# Patient Record
Sex: Female | Born: 1955 | Race: White | Hispanic: No | Marital: Married | Smoking: Never smoker
Health system: Southern US, Community
[De-identification: ages and names within clinical notes are randomized; demographics above are authoritative.]

## PROBLEM LIST (undated history)

## (undated) DIAGNOSIS — E079 Disorder of thyroid, unspecified: Secondary | ICD-10-CM

## (undated) DIAGNOSIS — H539 Unspecified visual disturbance: Secondary | ICD-10-CM

## (undated) HISTORY — DX: Unspecified visual disturbance: H53.9

## (undated) HISTORY — DX: Disorder of thyroid, unspecified: E07.9

## (undated) HISTORY — PX: WISDOM TOOTH EXTRACTION: SHX21

## (undated) HISTORY — PX: TONSILLECTOMY: SUR1361

---

## 1999-09-13 ENCOUNTER — Other Ambulatory Visit: Admission: RE | Admit: 1999-09-13 | Discharge: 1999-09-13 | Payer: Self-pay | Admitting: Obstetrics and Gynecology

## 2003-04-25 ENCOUNTER — Other Ambulatory Visit: Admission: RE | Admit: 2003-04-25 | Discharge: 2003-04-25 | Payer: Self-pay | Admitting: Gynecology

## 2006-01-04 ENCOUNTER — Emergency Department (HOSPITAL_COMMUNITY): Admission: EM | Admit: 2006-01-04 | Discharge: 2006-01-04 | Payer: Self-pay | Admitting: Family Medicine

## 2007-08-12 ENCOUNTER — Ambulatory Visit: Payer: Self-pay | Admitting: Internal Medicine

## 2007-08-26 ENCOUNTER — Encounter: Payer: Self-pay | Admitting: Internal Medicine

## 2007-08-26 ENCOUNTER — Ambulatory Visit: Payer: Self-pay | Admitting: Internal Medicine

## 2009-07-07 DIAGNOSIS — E063 Autoimmune thyroiditis: Secondary | ICD-10-CM | POA: Insufficient documentation

## 2009-09-07 DIAGNOSIS — M899 Disorder of bone, unspecified: Secondary | ICD-10-CM | POA: Insufficient documentation

## 2009-09-07 DIAGNOSIS — Z0000 Encounter for general adult medical examination without abnormal findings: Secondary | ICD-10-CM | POA: Insufficient documentation

## 2009-09-14 ENCOUNTER — Encounter: Admission: RE | Admit: 2009-09-14 | Discharge: 2009-09-14 | Payer: Self-pay | Admitting: Internal Medicine

## 2010-09-13 DIAGNOSIS — N6019 Diffuse cystic mastopathy of unspecified breast: Secondary | ICD-10-CM | POA: Insufficient documentation

## 2010-10-01 ENCOUNTER — Encounter: Admission: RE | Admit: 2010-10-01 | Discharge: 2010-10-01 | Payer: Self-pay | Admitting: Internal Medicine

## 2011-09-18 ENCOUNTER — Inpatient Hospital Stay (INDEPENDENT_AMBULATORY_CARE_PROVIDER_SITE_OTHER)
Admission: RE | Admit: 2011-09-18 | Discharge: 2011-09-18 | Disposition: A | Discharge status: Home / Self Care | Payer: 59 | Source: Ambulatory Visit | Attending: Family Medicine | Admitting: Family Medicine

## 2011-09-18 DIAGNOSIS — L259 Unspecified contact dermatitis, unspecified cause: Secondary | ICD-10-CM

## 2012-08-19 ENCOUNTER — Other Ambulatory Visit: Payer: Self-pay | Admitting: Internal Medicine

## 2012-08-19 DIAGNOSIS — Z1231 Encounter for screening mammogram for malignant neoplasm of breast: Secondary | ICD-10-CM

## 2012-09-03 ENCOUNTER — Ambulatory Visit: Payer: 59

## 2012-09-09 ENCOUNTER — Ambulatory Visit: Payer: 59

## 2012-10-01 ENCOUNTER — Ambulatory Visit
Admission: RE | Admit: 2012-10-01 | Discharge: 2012-10-01 | Disposition: A | Discharge status: Home / Self Care | Payer: 59 | Source: Ambulatory Visit | Attending: Internal Medicine | Admitting: Internal Medicine

## 2012-10-01 DIAGNOSIS — Z1231 Encounter for screening mammogram for malignant neoplasm of breast: Secondary | ICD-10-CM

## 2012-12-02 DIAGNOSIS — E559 Vitamin D deficiency, unspecified: Secondary | ICD-10-CM | POA: Insufficient documentation

## 2014-02-15 DIAGNOSIS — E039 Hypothyroidism, unspecified: Secondary | ICD-10-CM | POA: Insufficient documentation

## 2015-08-29 ENCOUNTER — Other Ambulatory Visit: Payer: Self-pay | Admitting: Internal Medicine

## 2015-08-29 DIAGNOSIS — Z1231 Encounter for screening mammogram for malignant neoplasm of breast: Secondary | ICD-10-CM

## 2015-09-07 ENCOUNTER — Ambulatory Visit
Admission: RE | Admit: 2015-09-07 | Discharge: 2015-09-07 | Disposition: A | Discharge status: Home / Self Care | Payer: BLUE CROSS/BLUE SHIELD | Source: Ambulatory Visit | Attending: Internal Medicine | Admitting: Internal Medicine

## 2015-09-07 ENCOUNTER — Other Ambulatory Visit: Payer: Self-pay | Admitting: Internal Medicine

## 2015-09-07 DIAGNOSIS — Z1231 Encounter for screening mammogram for malignant neoplasm of breast: Secondary | ICD-10-CM

## 2016-07-04 DIAGNOSIS — M859 Disorder of bone density and structure, unspecified: Secondary | ICD-10-CM | POA: Diagnosis not present

## 2016-07-04 DIAGNOSIS — Z0000 Encounter for general adult medical examination without abnormal findings: Secondary | ICD-10-CM | POA: Diagnosis not present

## 2016-07-04 DIAGNOSIS — E063 Autoimmune thyroiditis: Secondary | ICD-10-CM | POA: Diagnosis not present

## 2016-07-19 DIAGNOSIS — Z1389 Encounter for screening for other disorder: Secondary | ICD-10-CM | POA: Diagnosis not present

## 2016-07-19 DIAGNOSIS — M898X1 Other specified disorders of bone, shoulder: Secondary | ICD-10-CM | POA: Diagnosis not present

## 2016-07-19 DIAGNOSIS — E038 Other specified hypothyroidism: Secondary | ICD-10-CM | POA: Diagnosis not present

## 2016-07-19 DIAGNOSIS — Z0000 Encounter for general adult medical examination without abnormal findings: Secondary | ICD-10-CM | POA: Diagnosis not present

## 2016-07-29 DIAGNOSIS — D225 Melanocytic nevi of trunk: Secondary | ICD-10-CM | POA: Diagnosis not present

## 2016-07-29 DIAGNOSIS — L738 Other specified follicular disorders: Secondary | ICD-10-CM | POA: Diagnosis not present

## 2016-07-29 DIAGNOSIS — D2262 Melanocytic nevi of left upper limb, including shoulder: Secondary | ICD-10-CM | POA: Diagnosis not present

## 2016-07-29 DIAGNOSIS — L821 Other seborrheic keratosis: Secondary | ICD-10-CM | POA: Diagnosis not present

## 2016-07-29 DIAGNOSIS — L570 Actinic keratosis: Secondary | ICD-10-CM | POA: Diagnosis not present

## 2016-08-21 DIAGNOSIS — L308 Other specified dermatitis: Secondary | ICD-10-CM | POA: Diagnosis not present

## 2016-08-26 DIAGNOSIS — B86 Scabies: Secondary | ICD-10-CM | POA: Diagnosis not present

## 2016-09-12 DIAGNOSIS — L2089 Other atopic dermatitis: Secondary | ICD-10-CM | POA: Diagnosis not present

## 2016-09-23 DIAGNOSIS — R300 Dysuria: Secondary | ICD-10-CM | POA: Diagnosis not present

## 2016-09-23 DIAGNOSIS — N390 Urinary tract infection, site not specified: Secondary | ICD-10-CM | POA: Diagnosis not present

## 2016-10-14 DIAGNOSIS — R300 Dysuria: Secondary | ICD-10-CM | POA: Diagnosis not present

## 2016-10-14 DIAGNOSIS — R358 Other polyuria: Secondary | ICD-10-CM | POA: Diagnosis not present

## 2016-11-18 DIAGNOSIS — R300 Dysuria: Secondary | ICD-10-CM | POA: Diagnosis not present

## 2016-11-18 DIAGNOSIS — B349 Viral infection, unspecified: Secondary | ICD-10-CM | POA: Diagnosis not present

## 2016-11-18 DIAGNOSIS — J45909 Unspecified asthma, uncomplicated: Secondary | ICD-10-CM | POA: Diagnosis not present

## 2016-11-18 DIAGNOSIS — R5381 Other malaise: Secondary | ICD-10-CM | POA: Diagnosis not present

## 2016-11-18 DIAGNOSIS — R05 Cough: Secondary | ICD-10-CM | POA: Diagnosis not present

## 2016-11-18 DIAGNOSIS — N390 Urinary tract infection, site not specified: Secondary | ICD-10-CM | POA: Diagnosis not present

## 2016-11-18 DIAGNOSIS — E038 Other specified hypothyroidism: Secondary | ICD-10-CM | POA: Diagnosis not present

## 2017-01-06 ENCOUNTER — Other Ambulatory Visit: Payer: Self-pay | Admitting: Urology

## 2017-01-06 DIAGNOSIS — R3914 Feeling of incomplete bladder emptying: Secondary | ICD-10-CM

## 2017-01-06 DIAGNOSIS — N3020 Other chronic cystitis without hematuria: Secondary | ICD-10-CM | POA: Diagnosis not present

## 2017-01-06 DIAGNOSIS — N952 Postmenopausal atrophic vaginitis: Secondary | ICD-10-CM | POA: Diagnosis not present

## 2017-01-15 ENCOUNTER — Ambulatory Visit (HOSPITAL_COMMUNITY): Admission: RE | Admit: 2017-01-15 | Payer: BLUE CROSS/BLUE SHIELD | Source: Ambulatory Visit

## 2017-01-15 ENCOUNTER — Ambulatory Visit (HOSPITAL_COMMUNITY): Payer: BLUE CROSS/BLUE SHIELD

## 2017-01-15 DIAGNOSIS — N200 Calculus of kidney: Secondary | ICD-10-CM | POA: Diagnosis not present

## 2017-01-15 DIAGNOSIS — N3020 Other chronic cystitis without hematuria: Secondary | ICD-10-CM | POA: Diagnosis not present

## 2017-02-10 DIAGNOSIS — R3914 Feeling of incomplete bladder emptying: Secondary | ICD-10-CM | POA: Diagnosis not present

## 2017-02-10 DIAGNOSIS — N952 Postmenopausal atrophic vaginitis: Secondary | ICD-10-CM | POA: Diagnosis not present

## 2017-02-10 DIAGNOSIS — N200 Calculus of kidney: Secondary | ICD-10-CM | POA: Diagnosis not present

## 2017-02-10 DIAGNOSIS — N3020 Other chronic cystitis without hematuria: Secondary | ICD-10-CM | POA: Diagnosis not present

## 2017-02-12 DIAGNOSIS — N319 Neuromuscular dysfunction of bladder, unspecified: Secondary | ICD-10-CM | POA: Diagnosis not present

## 2017-02-12 DIAGNOSIS — R209 Unspecified disturbances of skin sensation: Secondary | ICD-10-CM | POA: Diagnosis not present

## 2017-02-12 DIAGNOSIS — R5381 Other malaise: Secondary | ICD-10-CM | POA: Diagnosis not present

## 2017-02-12 DIAGNOSIS — Z6826 Body mass index (BMI) 26.0-26.9, adult: Secondary | ICD-10-CM | POA: Diagnosis not present

## 2017-02-26 ENCOUNTER — Other Ambulatory Visit (HOSPITAL_COMMUNITY): Payer: Self-pay | Admitting: Internal Medicine

## 2017-02-26 DIAGNOSIS — R209 Unspecified disturbances of skin sensation: Principal | ICD-10-CM

## 2017-02-26 DIAGNOSIS — IMO0001 Reserved for inherently not codable concepts without codable children: Secondary | ICD-10-CM

## 2017-02-27 ENCOUNTER — Ambulatory Visit (HOSPITAL_COMMUNITY): Admission: RE | Admit: 2017-02-27 | Payer: BLUE CROSS/BLUE SHIELD | Source: Ambulatory Visit

## 2017-03-03 ENCOUNTER — Encounter: Payer: Self-pay | Admitting: Neurology

## 2017-03-03 ENCOUNTER — Ambulatory Visit (INDEPENDENT_AMBULATORY_CARE_PROVIDER_SITE_OTHER): Payer: BLUE CROSS/BLUE SHIELD | Admitting: Neurology

## 2017-03-03 ENCOUNTER — Encounter (INDEPENDENT_AMBULATORY_CARE_PROVIDER_SITE_OTHER): Payer: Self-pay

## 2017-03-03 VITALS — BP 145/92 | HR 77 | Resp 16 | Ht 66.0 in | Wt 166.0 lb

## 2017-03-03 DIAGNOSIS — N390 Urinary tract infection, site not specified: Secondary | ICD-10-CM | POA: Diagnosis not present

## 2017-03-03 DIAGNOSIS — R5383 Other fatigue: Secondary | ICD-10-CM | POA: Diagnosis not present

## 2017-03-03 DIAGNOSIS — Z820 Family history of epilepsy and other diseases of the nervous system: Secondary | ICD-10-CM | POA: Diagnosis not present

## 2017-03-03 DIAGNOSIS — R350 Frequency of micturition: Secondary | ICD-10-CM

## 2017-03-03 DIAGNOSIS — N312 Flaccid neuropathic bladder, not elsewhere classified: Secondary | ICD-10-CM | POA: Diagnosis not present

## 2017-03-03 NOTE — Progress Notes (Signed)
[facility]  PATIENT: Sandra Wyatt DOB: 1956-03-03  REFERRING DOCTOR OR PCP:  Dr. Osborne Wyatt (PCP); Dr. Tresa Wyatt (Urology) SOURCE: patient, notes from Dr. Osborne Wyatt and Sandra Wyatt.  _________________________________   HISTORICAL  CHIEF COMPLAINT:  Chief Complaint  Patient presents with  . Bladder Disturbance    Sandra Wyatt is here for r/o MS.  Sts. she was seen by Urology (Dr. Tresa Wyatt), for incomplete emptying of bladder.  She also c/o intermittent vibration sensation both feet onset about 46mos. ago.  Sts. her brother was dx. with MS at age 56, has never received any treatment and has had no further sx.  Also has a niece recently dx. with MS.  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  She is a 61 yo woman who has had recurrent UTIs x 2-3 years.  She was referred to urology and a PVR was elevated at 322 mL.    Also, when she had an ultrasound (pelvic) 6 years ago, she was noted to have a post void residual.    Her last UTI was successfully treated with Keflex.   She has had multiple UTIs. Years. She notes urinary retention and difficulty emptying her bladder at baseline. The symptoms worsen when she had an infection.  About 3 years ago, she noted an episode with blurry vision out of the left eye for several days.   She has not noted any episodes of difficult gait, numbness, weakness or ataxia.   However, 6 months ago, she began to experience a vibrating sensation in her legs, left more than right.   She feels her muscles are tense and can't completely relax in her legs.    She has noted much more fatigue recently.   She has hypothyroid but is on synthroid and labs have been good.    She sleeps well at night.   She snores but has never been noted to have pauses in breathing or  night time gasping/snorting.     She denies depression or anxiety.    She denies any cognitive issues.     I reviewed notes from Dr. Tresa Wyatt at [facility] u[facility]. A post void residual was 322 mL and she has recurrent UTIs. She is her to have a hypocontractile bladder.  Notes from Dr. Osborne Wyatt were also reviewed.  Her brother was diagnosed with MS at age 17 when he had severe numbness but has done well.   His diagnosis was apparently confirmed with CSF analysis.   His daughter (her niece) was diagnosed with MS in her early 41's and recently had optic neuritis.     REVIEW OF SYSTEMS: Constitutional: No fevers, chills, sweats, or change in appetite Eyes: No visual changes, double vision, eye pain Ear, nose and throat: No hearing loss, ear pain, nasal congestion, sore throat Cardiovascular: No chest pain, palpitations Respiratory: No shortness of breath at rest or with exertion.   No wheezes GastrointestinaI: No nausea, vomiting, diarrhea, abdominal pain, fecal incontinence Genitourinary: No dysuria, urinary retention or frequency.  No nocturia. Musculoskeletal: No neck pain, back pain Integumentary: No rash, pruritus, skin lesions Neurological: as above Psychiatric: No depression at this time.  No anxiety Endocrine: No palpitations, diaphoresis, change in appetite, change in weigh or increased thirst Hematologic/Lymphatic: No anemia, purpura, petechiae. Allergic/Immunologic: No itchy/runny eyes, nasal congestion, recent allergic reactions, rashes  ALLERGIES: Allergies  Allergen Reactions  . Sulfa Antibiotics Rash    HOME MEDICATIONS:  Current Outpatient Prescriptions:  .  PREMARIN vaginal cream, INSERT 1gram via applicator VAGINALLY ONCE WEEKLY, Disp: , Rfl: 11 .  SYNTHROID 112 MCG tablet, Take 112 mcg by mouth daily., Disp: , Rfl: 5  PAST MEDICAL HISTORY: Past Medical History:  Diagnosis Date  . Vision abnormalities     PAST SURGICAL HISTORY: Past Surgical History:  Procedure  Laterality Date  . TONSILLECTOMY    . WISDOM TOOTH EXTRACTION      FAMILY HISTORY: Family History  Problem Relation Age of Onset  . Stroke Mother   . Heart disease Mother   . Diabetes Father   . Migraines Sister   . Fibromyalgia Sister   . Multiple sclerosis Brother   . Lymphoma Sister   . Renal Disease Sister   . Multiple sclerosis Other   . HIV/AIDS Brother     SOCIAL HISTORY:  Social History   Social History  . Marital status: Married    Spouse name: N/A  . Number of children: N/A  . Years of education: N/A   Occupational History  . Not on file.   Social History Main Topics  . Smoking status: Never Smoker  . Smokeless tobacco: Never Used  . Alcohol use Yes     Comment: rare  . Drug use: No  . Sexual activity: Not on file   Other Topics Concern  . Not on file   Social History Narrative  . No narrative on file     PHYSICAL EXAM  Vitals:   03/03/17 0901  BP: (!) 145/92  Pulse: 77  Resp: 16  Weight: 166 lb (75.3 kg)  Height: 5\' 6"  (1.676 m)    Body mass index is 26.79 kg/m.   General: The patient is well-developed and well-nourished and in no acute distress  Eyes:  Funduscopic exam shows normal optic discs and retinal vessels.  Neck: The neck is supple, no carotid bruits are noted.  The neck is nontender.  Cardiovascular: The heart has a regular rate and rhythm with a normal S1 and S2. There were no murmurs, gallops or rubs. Lungs are clear to auscultation.  Skin: Extremities are without significant edema.  Musculoskeletal:  Back is nontender  Neurologic Exam  Mental status: The patient is alert and oriented x 3 at the time of the examination. The patient has apparent normal recent and remote memory, with an apparently normal attention span and concentration ability.   Speech is normal.  Cranial nerves: Extraocular movements are full. Pupils are equal, round, and reactive to light and accomodation.  Visual fields are full.  Facial  symmetry is present. There is good facial sensation to soft touch bilaterally.Facial strength is normal.  Trapezius and sternocleidomastoid strength is normal. No dysarthria is noted.  The tongue is midline, and the patient has symmetric elevation of the soft palate. No obvious hearing deficits are noted.  Motor:  Muscle bulk is normal.   Tone is normal. Strength is  5 / 5 in all 4 extremities.   Sensory: Sensory testing is intact to pinprick, soft touch and vibration sensation in all 4 extremities.  Coordination: Cerebellar testing reveals good finger-nose-finger and heel-to-shin bilaterally.  Gait and station: Station is normal.   Gait is normal. Tandem gait is normal. Romberg is negative.   Reflexes: Deep tendon reflexes are symmetric and 2+ bilaterally in arms and 3+ at the knees and 2+ at the ankles.Marland Kitchen  Plantar responses are flexor.    DIAGNOSTIC DATA (LABS, IMAGING, TESTING) - I reviewed patient records, labs, notes, testing and imaging myself where available.    ASSESSMENT AND PLAN  Hypotonic bladder - Plan: MR BRAIN W WO CONTRAST, MR CERVICAL SPINE W WO CONTRAST, MR THORACIC SPINE W WO CONTRAST  Other fatigue  Family history of MS (multiple sclerosis) - Plan: MR BRAIN W WO CONTRAST, MR CERVICAL SPINE W WO CONTRAST, MR THORACIC SPINE W WO CONTRAST  Frequent UTI  Urinary frequency   In summary, Mrs. Lokken is a 61 year old woman who has had a two to three-year history of hypotonic bladder and also had an episode of visual disturbance lasting several days about 3 years ago.   More recently she has had a vibrating sensory alteration in her legs, left greater than right, and the onset of fatigue without explanation. She has a strong family history of MS (Brother and niece).   We discussed that having a first-degree relative with MS does increase her risk by at least a factor of 10 (to about 1:50).    We need to obtain an MRI of the brain and the cervical and thoracic spine to  determine the etiology of her bladder hypotonia and other symptoms.  I am less concerned about a cauda equina syndrome so we do not need to check a lumbar spine at this time.   If she has lesions consistent with MS, then I will discuss the various disease modifying therapies with her and initiate one. If she has a compressive myelopathy or other finding, we will need to consider referral to neurosurgery or other treatment.     If the bladder retention worsens, consider bethanechol or Flomax or other treatment.    She'll return to see me in 3 months but we will call her with the results of the MRIs and move her appointment up much sooner based on the results. She should call us if she has any new or worsening neurologic symptoms.  Thank you for asking me to see Mrs. Juenger for neurologic consultation. Please let me know if I can be of further assistance with her or other patients in the future. Patrice Moates A. Felecia Shelling, MD, PhD 0/51/1021, 1:17 AM Certified in Neurology, Clinical Neurophysiology, Sleep Medicine, Pain Medicine and Neuroimaging  [facility] [address] [place], [place] [address] [phone]

## 2017-03-04 ENCOUNTER — Other Ambulatory Visit: Payer: Self-pay | Admitting: Internal Medicine

## 2017-03-04 ENCOUNTER — Telehealth: Payer: Self-pay | Admitting: Neurology

## 2017-03-04 DIAGNOSIS — R209 Unspecified disturbances of skin sensation: Principal | ICD-10-CM

## 2017-03-04 DIAGNOSIS — IMO0001 Reserved for inherently not codable concepts without codable children: Secondary | ICD-10-CM

## 2017-03-04 NOTE — Telephone Encounter (Signed)
Noted/fim 

## 2017-03-04 NOTE — Telephone Encounter (Signed)
I spoke with the patient and informed her of the situation how Dr. Osborne Casco has the MR Brain approved under his name and it still has valid dates.. I informed her to go to [facility] because she can have all 3 of the exams there and Dr. Felecia Shelling would be able to see all 3 of the exams..I gave her GI phone number she stated she would call and schedule.Marland Kitchen

## 2017-03-04 NOTE — Telephone Encounter (Signed)
Sandra Wyatt it is a MR brain w/wo contrast and it is approved from 02/27/17 to 03/27/17 by Dr. Osborne Casco office.

## 2017-03-04 NOTE — Telephone Encounter (Signed)
I am unable to get the MR Brain approved because Dr. Osborne Casco already approved that under his name with expire dates of 02/27/17 to 03/27/17. I did get the MR Cervical & MR Thoracic approved.. Do you want me to proceed with having her Cervical & Thoracic scheduled?

## 2017-03-05 ENCOUNTER — Ambulatory Visit
Admission: RE | Admit: 2017-03-05 | Discharge: 2017-03-05 | Disposition: A | Discharge status: Home / Self Care | Payer: BLUE CROSS/BLUE SHIELD | Source: Ambulatory Visit | Attending: Internal Medicine | Admitting: Internal Medicine

## 2017-03-05 DIAGNOSIS — R209 Unspecified disturbances of skin sensation: Principal | ICD-10-CM

## 2017-03-05 DIAGNOSIS — Z820 Family history of epilepsy and other diseases of the nervous system: Secondary | ICD-10-CM | POA: Diagnosis not present

## 2017-03-05 DIAGNOSIS — IMO0001 Reserved for inherently not codable concepts without codable children: Secondary | ICD-10-CM

## 2017-03-05 MED ORDER — GADOBENATE DIMEGLUMINE 529 MG/ML IV SOLN
15.0000 mL | Freq: Once | INTRAVENOUS | Status: AC | PRN
  Administered 2017-03-05: 15 mL via INTRAVENOUS

## 2017-03-06 ENCOUNTER — Telehealth: Payer: Self-pay | Admitting: Neurology

## 2017-03-06 NOTE — Telephone Encounter (Signed)
I have spoken with pt. and explained that, since MRI brain was done at [facility], [facility] will be able to see it/fim

## 2017-03-06 NOTE — Telephone Encounter (Signed)
Pt wanted Dr Felecia Shelling to know that  Dr.Tisovec([facility] [phone]) did the MRI on yesterday and is not sure if the results would be only given to Dr Osborne Casco or shared with Dr Felecia Shelling as well. A message can be left on her home or cell # if need be

## 2017-03-23 ENCOUNTER — Ambulatory Visit
Admission: RE | Admit: 2017-03-23 | Discharge: 2017-03-23 | Disposition: A | Discharge status: Home / Self Care | Payer: BLUE CROSS/BLUE SHIELD | Source: Ambulatory Visit | Attending: Neurology | Admitting: Neurology

## 2017-03-23 DIAGNOSIS — Z820 Family history of epilepsy and other diseases of the nervous system: Secondary | ICD-10-CM

## 2017-03-23 DIAGNOSIS — M4802 Spinal stenosis, cervical region: Secondary | ICD-10-CM | POA: Diagnosis not present

## 2017-03-23 DIAGNOSIS — M5124 Other intervertebral disc displacement, thoracic region: Secondary | ICD-10-CM | POA: Diagnosis not present

## 2017-03-23 DIAGNOSIS — N312 Flaccid neuropathic bladder, not elsewhere classified: Secondary | ICD-10-CM

## 2017-03-23 MED ORDER — GADOBENATE DIMEGLUMINE 529 MG/ML IV SOLN
15.0000 mL | Freq: Once | INTRAVENOUS | Status: AC | PRN
  Administered 2017-03-23: 15 mL via INTRAVENOUS

## 2017-03-25 ENCOUNTER — Telehealth: Payer: Self-pay | Admitting: *Deleted

## 2017-03-25 NOTE — Telephone Encounter (Signed)
-----   Message from Britt Bottom, MD sent at 03/24/2017  5:42 PM EDT ----- Please let her know that the MRI of the cervical spine all degenerative changes (with mild spinal stenosis at C5-C6) but nothing bad enough to affect bladder function.    The spinal cord appeared normal. The MRI of the thoracic spine was normal.

## 2017-03-25 NOTE — Telephone Encounter (Signed)
I have spoken with Nalea this morning and per RAS, reviewed MRI results as below.  She verbalized understanding of same/fim

## 2017-05-06 DIAGNOSIS — S30861A Insect bite (nonvenomous) of abdominal wall, initial encounter: Secondary | ICD-10-CM | POA: Diagnosis not present

## 2017-05-06 DIAGNOSIS — Z6826 Body mass index (BMI) 26.0-26.9, adult: Secondary | ICD-10-CM | POA: Diagnosis not present

## 2017-05-06 DIAGNOSIS — W57XXXA Bitten or stung by nonvenomous insect and other nonvenomous arthropods, initial encounter: Secondary | ICD-10-CM | POA: Diagnosis not present

## 2017-05-24 ENCOUNTER — Ambulatory Visit (HOSPITAL_COMMUNITY)
Admission: EM | Admit: 2017-05-24 | Discharge: 2017-05-24 | Disposition: A | Discharge status: Home / Self Care | Payer: BLUE CROSS/BLUE SHIELD | Attending: Internal Medicine | Admitting: Internal Medicine

## 2017-05-24 ENCOUNTER — Telehealth (HOSPITAL_COMMUNITY): Payer: Self-pay | Admitting: Emergency Medicine

## 2017-05-24 ENCOUNTER — Encounter (HOSPITAL_COMMUNITY): Payer: Self-pay | Admitting: Emergency Medicine

## 2017-05-24 DIAGNOSIS — R42 Dizziness and giddiness: Secondary | ICD-10-CM | POA: Insufficient documentation

## 2017-05-24 DIAGNOSIS — R300 Dysuria: Secondary | ICD-10-CM | POA: Insufficient documentation

## 2017-05-24 DIAGNOSIS — R110 Nausea: Secondary | ICD-10-CM | POA: Diagnosis not present

## 2017-05-24 LAB — POCT URINALYSIS DIP (DEVICE)
Bilirubin Urine: NEGATIVE
Glucose, UA: NEGATIVE mg/dL
Hgb urine dipstick: NEGATIVE
KETONES UR: NEGATIVE mg/dL
Leukocytes, UA: NEGATIVE
Nitrite: NEGATIVE
PH: 7 (ref 5.0–8.0)
PROTEIN: NEGATIVE mg/dL
Specific Gravity, Urine: 1.015 (ref 1.005–1.030)
Urobilinogen, UA: 0.2 mg/dL (ref 0.0–1.0)

## 2017-05-24 LAB — POCT I-STAT, CHEM 8
BUN: 14 mg/dL (ref 6–20)
CALCIUM ION: 1.19 mmol/L (ref 1.15–1.40)
Chloride: 106 mmol/L (ref 101–111)
Creatinine, Ser: 0.8 mg/dL (ref 0.44–1.00)
Glucose, Bld: 91 mg/dL (ref 65–99)
HCT: 40 % (ref 36.0–46.0)
Hemoglobin: 13.6 g/dL (ref 12.0–15.0)
Potassium: 3.9 mmol/L (ref 3.5–5.1)
SODIUM: 139 mmol/L (ref 135–145)
TCO2: 26 mmol/L (ref 0–100)

## 2017-05-24 MED ORDER — ONDANSETRON 4 MG PO TBDP
ORAL_TABLET | ORAL | Status: AC
  Filled 2017-05-24: qty 2

## 2017-05-24 MED ORDER — ONDANSETRON 4 MG PO TBDP: 4.0000 mg | ORAL_TABLET | Freq: Three times a day (TID) | ORAL | 0 refills | Status: DC | PRN

## 2017-05-24 MED ORDER — ONDANSETRON 4 MG PO TBDP
8.0000 mg | ORAL_TABLET | Freq: Once | ORAL | Status: AC
  Administered 2017-05-24: 8 mg via ORAL

## 2017-05-24 NOTE — ED Triage Notes (Signed)
Pt c/o feeling nauseas onset 3-7 days associated w/dizzienss and fatigue   Denies fevers, chills, abd pain, v/d  A&O x4... NAD... Ambulatory

## 2017-05-24 NOTE — ED Provider Notes (Signed)
CSN: [id number]     Arrival date & time 05/24/17  1657 History   None    Chief Complaint  Patient presents with  . Nausea   (Consider location/radiation/quality/duration/timing/severity/associated sxs/prior Treatment) 61 year old female presents to clinic with a 3-7 day history of dizziness, nausea, and also some dysuria. She is followed by urologist, in early this week she took Keflex  because she beleaves she is having a UTI. She also reports having found tick that was attached approximately 2 weeks ago, she removed the tick, is concerned that the symptoms might somehow be related.   The history is provided by the patient.    Past Medical History:  Diagnosis Date  . Vision abnormalities    Past Surgical History:  Procedure Laterality Date  . TONSILLECTOMY    . WISDOM TOOTH EXTRACTION     Family History  Problem Relation Age of Onset  . Stroke Mother   . Heart disease Mother   . Diabetes Father   . Migraines Sister   . Fibromyalgia Sister   . Multiple sclerosis Brother   . Lymphoma Sister   . Renal Disease Sister   . Multiple sclerosis Other   . HIV/AIDS Brother    Social History  Substance Use Topics  . Smoking status: Never Smoker  . Smokeless tobacco: Never Used  . Alcohol use Yes     Comment: rare   OB History    No data available     Review of Systems  Constitutional: Negative.   HENT: Negative.   Respiratory: Negative.   Cardiovascular: Negative.   Gastrointestinal: Positive for nausea. Negative for abdominal pain, diarrhea and vomiting.  Genitourinary: Positive for frequency and urgency. Negative for dysuria.  Musculoskeletal: Negative.   Skin: Negative.   Neurological: Negative.     Allergies  Sulfa antibiotics  Home Medications   Prior to Admission medications   Medication Sig Start Date End Date Taking? Authorizing Provider  SYNTHROID 112 MCG tablet Take 112 mcg by mouth daily. 02/14/17  Yes [provider]  ondansetron (ZOFRAN ODT)  4 MG disintegrating tablet Take 1 tablet (4 mg total) by mouth every 8 (eight) hours as needed for nausea or vomiting. 05/24/17   Barnet Glasgow, NP  PREMARIN vaginal cream INSERT 1gram via applicator VAGINALLY ONCE WEEKLY 01/06/17   [provider]   Meds Ordered and Administered this Visit   Medications  ondansetron (ZOFRAN-ODT) disintegrating tablet 8 mg (8 mg Oral Given 05/24/17 1820)    BP 134/86 (BP Location: Right Arm)   Pulse 74   Temp 99.5 F (37.5 C) (Oral)   Resp 18   SpO2 98%  No data found.   Physical Exam  Constitutional: She is oriented to person, place, and time. She appears well-developed and well-nourished. No distress.  HENT:  Head: Normocephalic.  Right Ear: External ear normal.  Left Ear: External ear normal.  Eyes: Conjunctivae are normal.  Neck: Normal range of motion.  Cardiovascular: Normal rate and regular rhythm.   Pulmonary/Chest: Effort normal and breath sounds normal.  Abdominal: Soft. Bowel sounds are normal.  Neurological: She is alert and oriented to person, place, and time.  Skin: Skin is warm and dry. Capillary refill takes less than 2 seconds. No rash noted. She is not diaphoretic.  Nursing note and vitals reviewed.   Urgent Care Course     Procedures (including critical care time)  Labs Review Labs Reviewed  URINE CULTURE  B. BURGDORFI ANTIBODIES  ROCKY MTN SPOTTED FVR ABS PNL(IGG+IGM)  POCT URINALYSIS DIP (DEVICE)  POCT I-STAT, CHEM 8    Imaging Review No results found.      MDM   1. Nausea    UA negative for UTI, urine sent for culture to confirm, blood drawn to check for alignment [facility] spotted fever, we'll notify the results of these tests are positive. In prescription for Zofran, follow-up with primary care provider as needed.    Barnet Glasgow, NP 05/24/17 2025

## 2017-05-24 NOTE — Discharge Instructions (Signed)
Your urine, and blood, has been sent off for the lab. If anything comes back with these tests, we will contact you and provided follow-up directions. If your symptoms persist, or fail to resolve, follow up with your primary care provider as needed. For Nausea, I have prescribed Zofran, take 1 tablet under the tongue every 8 hours as needed.

## 2017-05-24 NOTE — Telephone Encounter (Signed)
Pt seen earlier by Arnold Long, NP  Sts Rxs were sent to her pharmacy but they are closed... Wants to know if we can E-Rx them to [facility] off [address]  Re-sent Rx to pharmacy of choice

## 2017-05-26 LAB — B. BURGDORFI ANTIBODIES: B burgdorferi Ab IgG+IgM: <0.91 {ISR} (ref 0.00–0.90)

## 2017-05-26 LAB — URINE CULTURE

## 2017-05-27 LAB — ROCKY MTN SPOTTED FVR ABS PNL(IGG+IGM)
RMSF IGM: 0.44 {index} (ref 0.00–0.89)
RMSF IgG: NEGATIVE

## 2017-06-02 ENCOUNTER — Encounter: Payer: Self-pay | Admitting: Neurology

## 2017-06-02 ENCOUNTER — Ambulatory Visit (INDEPENDENT_AMBULATORY_CARE_PROVIDER_SITE_OTHER): Payer: BLUE CROSS/BLUE SHIELD | Admitting: Neurology

## 2017-06-02 VITALS — BP 128/84 | HR 75 | Resp 16 | Ht 66.0 in | Wt 163.5 lb

## 2017-06-02 DIAGNOSIS — R5383 Other fatigue: Secondary | ICD-10-CM | POA: Diagnosis not present

## 2017-06-02 DIAGNOSIS — N312 Flaccid neuropathic bladder, not elsewhere classified: Secondary | ICD-10-CM

## 2017-06-02 DIAGNOSIS — N390 Urinary tract infection, site not specified: Secondary | ICD-10-CM | POA: Diagnosis not present

## 2017-06-02 DIAGNOSIS — Z820 Family history of epilepsy and other diseases of the nervous system: Secondary | ICD-10-CM | POA: Diagnosis not present

## 2017-06-02 MED ORDER — TAMSULOSIN HCL 0.4 MG PO CAPS: 0.4000 mg | ORAL_CAPSULE | Freq: Every day | ORAL | 5 refills | Status: DC

## 2017-06-02 NOTE — Progress Notes (Signed)
[facility]  PATIENT: Sandra Wyatt DOB: July 30, 1956  REFERRING DOCTOR OR PCP:  Dr. Osborne Casco (PCP); Dr. Tresa Moore (Urology) SOURCE: patient, notes from Dr. Osborne Casco and Tresa Moore.  _________________________________   HISTORICAL  CHIEF COMPLAINT:  Chief Complaint  Patient presents with  . Bladder Disturbance    Here today to discuss MRI results.  Denies new or worsening sx/fim    HISTORY OF PRESENT ILLNESS:  Sandra Wyatt is a 61 yo woman Who is having recurrent urinary tract infections over the past few years and was found to have an elevated postvoid residual for a neurogenic bladder. Since the last visit I have checked an MRI of the cervical spine and MRI of the thoracic spine. The MRI of the thoracic spine was normal. The cervical spine does show degenerative changes at C4-C5, C5-C6 and C6-C7. She has mild spinal stenosis at C5-C6 but there does not appear to be any spinal cord compression or myelopathic signal. I reviewed the MRIs in her presence today. Of note, when she had an ultrasound (pelvic) 6-7 years ago, she was noted to have a post void residual.    She notes that even when younger, she is not sure she emptied her bladder well.    Her brother has multiple sclerosis. The MRI of the brain did show some white matter foci but the configuration is more consistent with mild age-related chronic microvascular ischemic change than with demyelination.    She has had some paresthesias but no weakness or difficulty with gait. Vision is fine.             She notes that she has had an intermittent rash this year. It is a macular and mostly on the arms and legs.    Her brother was diagnosed with MS at age 24 when he had severe numbness but has done well.   His diagnosis was apparently confirmed with CSF analysis.   His daughter (her niece) was diagnosed with MS in her early 22's and recently had optic neuritis.     REVIEW OF SYSTEMS: Constitutional: No fevers, chills, sweats,  or change in appetite Eyes: No visual changes, double vision, eye pain Ear, nose and throat: No hearing loss, ear pain, nasal congestion, sore throat Cardiovascular: No chest pain, palpitations Respiratory: No shortness of breath at rest or with exertion.   No wheezes GastrointestinaI: No nausea, vomiting, diarrhea, abdominal pain, fecal incontinence Genitourinary: No dysuria, urinary retention or frequency.  No nocturia. Musculoskeletal: No neck pain, back pain Integumentary: No rash, pruritus, skin lesions Neurological: as above Psychiatric: No depression at this time.  No anxiety Endocrine: No palpitations, diaphoresis, change in appetite, change in weigh or increased thirst Hematologic/Lymphatic: No anemia, purpura, petechiae. Allergic/Immunologic: No itchy/runny eyes, nasal congestion, recent allergic reactions, rashes  ALLERGIES: Allergies  Allergen Reactions  . Sulfa Antibiotics Rash    HOME MEDICATIONS:  Current Outpatient Prescriptions:  .  ondansetron (ZOFRAN ODT) 4 MG disintegrating tablet, Take 1 tablet (4 mg total) by mouth every 8 (eight) hours as needed for nausea or vomiting., Disp: 20 tablet, Rfl: 0 .  PREMARIN vaginal cream, INSERT 1gram via applicator VAGINALLY ONCE WEEKLY, Disp: , Rfl: 11 .  SYNTHROID 112 MCG tablet, Take 112 mcg by mouth daily., Disp: , Rfl: 5 .  tamsulosin (FLOMAX) 0.4 MG CAPS capsule, Take 1 capsule (0.4 mg total) by mouth daily., Disp: 30 capsule, Rfl: 5  PAST MEDICAL HISTORY: Past Medical History:  Diagnosis Date  . Vision abnormalities  PAST SURGICAL HISTORY: Past Surgical History:  Procedure Laterality Date  . TONSILLECTOMY    . WISDOM TOOTH EXTRACTION      FAMILY HISTORY: Family History  Problem Relation Age of Onset  . Stroke Mother   . Heart disease Mother   . Diabetes Father   . Migraines Sister   . Fibromyalgia Sister   . Multiple sclerosis Brother   . Lymphoma Sister   . Renal Disease Sister   . Multiple  sclerosis Other   . HIV/AIDS Brother     SOCIAL HISTORY:  Social History   Social History  . Marital status: Married    Spouse name: N/A  . Number of children: N/A  . Years of education: N/A   Occupational History  . Not on file.   Social History Main Topics  . Smoking status: Never Smoker  . Smokeless tobacco: Never Used  . Alcohol use Yes     Comment: rare  . Drug use: No  . Sexual activity: Not on file   Other Topics Concern  . Not on file   Social History Narrative  . No narrative on file     PHYSICAL EXAM  Vitals:   06/02/17 1004  BP: 128/84  Pulse: 75  Resp: 16  Weight: 163 lb 8 oz (74.2 kg)  Height: 5\' 6"  (1.676 m)    Body mass index is 26.39 kg/m.   General: The patient is well-developed and well-nourished and in no acute distress   Neck:  The neck is nontender.   Musculoskeletal:  Back is nontender and appears normal  Skin:   He has a diffuse macular spots on the arms more than legs. Some of the sports in the shoulder region follow her clothes.  Neurologic Exam  Mental status: The patient is alert and oriented x 3 at the time of the examination. The patient has apparent normal recent and remote memory, with an apparently normal attention span and concentration ability.   Speech is normal.  Cranial nerves:  EOMI. There is good facial sensation to soft touch bilaterally.Facial strength is normal.  Trapezius and sternocleidomastoid strength is normal. No dysarthria is noted.  The tongue is midline, and the patient has symmetric elevation of the soft palate. No obvious hearing deficits are noted.  Motor:  Muscle bulk, tone and strength is normal..   Sensory: He has normal sensation to touch and temperature.  Coordination: Cerebellar testing reveals good finger-nose-finger and heel-to-shin bilaterally.  Gait and station: Station is normal.   Gait is normal. Tandem gait is normal. Romberg is negative.   Reflexes: Deep tendon reflexes are  symmetric and 2+ bilaterally in arms and 3+ at the knees and 2+ at the ankles.Marland Kitchen     DIAGNOSTIC DATA (LABS, IMAGING, TESTING) - I reviewed patient records, labs, notes, testing and imaging myself where available.    ASSESSMENT AND PLAN  Hypotonic bladder  Other fatigue  Frequent UTI  Family history of MS (multiple sclerosis)   1.    The etiology of her urinary retention is unclear. She does not appear to have a difficult and brain or spinal cord changes.  2.    Trial of tamsulosin for urinary hesitancy and difficulty emptying. She is advised to stop if she gets a rash.  3.    If the tamsulosin does not help, consider a trial of bethanechol or similar medication.  4.    She will return to see me as needed if she has new or worsening neurologic  symptoms. If the bladder does not improve she is advised to return back to urology for further testing.   Jerren Flinchbaugh A. Felecia Shelling, MD, PhD 08/15/6741, 5:52 PM Certified in Neurology, Clinical Neurophysiology, Sleep Medicine, Pain Medicine and Neuroimaging  [facility] [address] [place], [place] [address] [phone]

## 2017-07-21 DIAGNOSIS — E038 Other specified hypothyroidism: Secondary | ICD-10-CM | POA: Diagnosis not present

## 2017-07-21 DIAGNOSIS — E559 Vitamin D deficiency, unspecified: Secondary | ICD-10-CM | POA: Diagnosis not present

## 2017-07-21 DIAGNOSIS — R300 Dysuria: Secondary | ICD-10-CM | POA: Diagnosis not present

## 2017-07-21 DIAGNOSIS — Z0000 Encounter for general adult medical examination without abnormal findings: Secondary | ICD-10-CM | POA: Diagnosis not present

## 2017-07-23 DIAGNOSIS — N319 Neuromuscular dysfunction of bladder, unspecified: Secondary | ICD-10-CM | POA: Diagnosis not present

## 2017-07-23 DIAGNOSIS — Z23 Encounter for immunization: Secondary | ICD-10-CM | POA: Diagnosis not present

## 2017-07-23 DIAGNOSIS — R209 Unspecified disturbances of skin sensation: Secondary | ICD-10-CM | POA: Diagnosis not present

## 2017-07-23 DIAGNOSIS — E038 Other specified hypothyroidism: Secondary | ICD-10-CM | POA: Diagnosis not present

## 2017-07-23 DIAGNOSIS — Z0000 Encounter for general adult medical examination without abnormal findings: Secondary | ICD-10-CM | POA: Diagnosis not present

## 2017-07-23 DIAGNOSIS — Z1389 Encounter for screening for other disorder: Secondary | ICD-10-CM | POA: Diagnosis not present

## 2017-07-23 DIAGNOSIS — E063 Autoimmune thyroiditis: Secondary | ICD-10-CM | POA: Diagnosis not present

## 2017-07-24 ENCOUNTER — Encounter: Payer: Self-pay | Admitting: Internal Medicine

## 2017-07-25 ENCOUNTER — Other Ambulatory Visit: Payer: Self-pay | Admitting: Internal Medicine

## 2017-07-25 DIAGNOSIS — Z1231 Encounter for screening mammogram for malignant neoplasm of breast: Secondary | ICD-10-CM

## 2017-07-28 DIAGNOSIS — Z1212 Encounter for screening for malignant neoplasm of rectum: Secondary | ICD-10-CM | POA: Diagnosis not present

## 2017-07-30 DIAGNOSIS — M898X1 Other specified disorders of bone, shoulder: Secondary | ICD-10-CM | POA: Diagnosis not present

## 2017-07-30 DIAGNOSIS — M859 Disorder of bone density and structure, unspecified: Secondary | ICD-10-CM | POA: Diagnosis not present

## 2017-08-05 ENCOUNTER — Ambulatory Visit
Admission: RE | Admit: 2017-08-05 | Discharge: 2017-08-05 | Disposition: A | Discharge status: Home / Self Care | Payer: BLUE CROSS/BLUE SHIELD | Source: Ambulatory Visit | Attending: Internal Medicine | Admitting: Internal Medicine

## 2017-08-05 DIAGNOSIS — Z1231 Encounter for screening mammogram for malignant neoplasm of breast: Secondary | ICD-10-CM

## 2017-08-05 DIAGNOSIS — Z124 Encounter for screening for malignant neoplasm of cervix: Secondary | ICD-10-CM | POA: Diagnosis not present

## 2017-08-05 DIAGNOSIS — Z01419 Encounter for gynecological examination (general) (routine) without abnormal findings: Secondary | ICD-10-CM | POA: Diagnosis not present

## 2017-08-05 DIAGNOSIS — Z6826 Body mass index (BMI) 26.0-26.9, adult: Secondary | ICD-10-CM | POA: Diagnosis not present

## 2017-09-17 DIAGNOSIS — J019 Acute sinusitis, unspecified: Secondary | ICD-10-CM | POA: Diagnosis not present

## 2017-09-17 DIAGNOSIS — Z6826 Body mass index (BMI) 26.0-26.9, adult: Secondary | ICD-10-CM | POA: Diagnosis not present

## 2017-10-01 ENCOUNTER — Ambulatory Visit (AMBULATORY_SURGERY_CENTER): Payer: Self-pay

## 2017-10-01 ENCOUNTER — Encounter: Payer: Self-pay | Admitting: Internal Medicine

## 2017-10-01 VITALS — Ht 65.5 in | Wt 166.8 lb

## 2017-10-01 DIAGNOSIS — Z1211 Encounter for screening for malignant neoplasm of colon: Secondary | ICD-10-CM

## 2017-10-01 MED ORDER — SUPREP BOWEL PREP KIT 17.5-3.13-1.6 GM/177ML PO SOLN: 1.0000 | Freq: Once | ORAL | 0 refills | Status: AC

## 2017-10-01 NOTE — Progress Notes (Signed)
No allergies to eggs or soy No past problems with anesthesia No diet meds No home oxygen  Declined emmi 

## 2017-10-06 ENCOUNTER — Telehealth: Payer: Self-pay | Admitting: Internal Medicine

## 2017-10-06 DIAGNOSIS — Z1211 Encounter for screening for malignant neoplasm of colon: Secondary | ICD-10-CM

## 2017-10-06 MED ORDER — NA SULFATE-K SULFATE-MG SULF 17.5-3.13-1.6 GM/177ML PO SOLN: 1.0000 | Freq: Once | ORAL | 0 refills | Status: AC

## 2017-10-06 NOTE — Telephone Encounter (Signed)
[facility]. Called pt and notified her it was called in [place]

## 2017-10-07 DIAGNOSIS — J019 Acute sinusitis, unspecified: Secondary | ICD-10-CM | POA: Diagnosis not present

## 2017-10-07 DIAGNOSIS — R05 Cough: Secondary | ICD-10-CM | POA: Diagnosis not present

## 2017-10-07 DIAGNOSIS — Z6826 Body mass index (BMI) 26.0-26.9, adult: Secondary | ICD-10-CM | POA: Diagnosis not present

## 2017-10-10 ENCOUNTER — Telehealth: Payer: Self-pay | Admitting: Internal Medicine

## 2017-10-10 ENCOUNTER — Telehealth: Payer: Self-pay | Admitting: *Deleted

## 2017-10-10 NOTE — Telephone Encounter (Signed)
Pt has a 15$ coupon at home for her prep   suprep PNM $50 coupon CALLED to [place] [id number] PCN [id number] GROUP [id number] ID [id number]  Pt aware   Lelan Pons PV

## 2017-10-10 NOTE — Telephone Encounter (Signed)
Pt has [employer] [place] and was given a coupon in [place] 10-17 but she has not taken the coupon to the pharmacy . instructed her to take the coupon to her pharmacy and remind them to run that through as cash and she should get the prep for $50. She stated she was not sure that she had the right coupon but will check and if it does say PNM than 50 she will just take it but if its a $15 dollar coupon she will call me back and I will just call the info in to her pharmacy today .   Lelan Pons PV

## 2017-10-15 ENCOUNTER — Encounter: Payer: Self-pay | Admitting: Internal Medicine

## 2017-10-15 ENCOUNTER — Ambulatory Visit (AMBULATORY_SURGERY_CENTER): Payer: BLUE CROSS/BLUE SHIELD | Admitting: Internal Medicine

## 2017-10-15 VITALS — BP 160/93 | HR 73 | Temp 98.0°F | Resp 14 | Ht 65.5 in | Wt 166.0 lb

## 2017-10-15 DIAGNOSIS — Z1211 Encounter for screening for malignant neoplasm of colon: Secondary | ICD-10-CM | POA: Diagnosis present

## 2017-10-15 DIAGNOSIS — D123 Benign neoplasm of transverse colon: Secondary | ICD-10-CM | POA: Diagnosis not present

## 2017-10-15 DIAGNOSIS — Z1212 Encounter for screening for malignant neoplasm of rectum: Secondary | ICD-10-CM

## 2017-10-15 MED ORDER — SODIUM CHLORIDE 0.9 % IV SOLN: 500.0000 mL | INTRAVENOUS | Status: DC

## 2017-10-15 NOTE — Progress Notes (Signed)
Report to PACU, RN, vss, BBS= Clear.  

## 2017-10-15 NOTE — Patient Instructions (Signed)
YOU HAD AN ENDOSCOPIC PROCEDURE TODAY AT [facility]:   Refer to the procedure report that was given to you for any specific questions about what was found during the examination.  If the procedure report does not answer your questions, please call your gastroenterologist to clarify.  If you requested that your care partner not be given the details of your procedure findings, then the procedure report has been included in a sealed envelope for you to review at your convenience later.  YOU SHOULD EXPECT: Some feelings of bloating in the abdomen. Passage of more gas than usual.  Walking can help get rid of the air that was put into your GI tract during the procedure and reduce the bloating. If you had a lower endoscopy (such as a colonoscopy or flexible sigmoidoscopy) you may notice spotting of blood in your stool or on the toilet paper. If you underwent a bowel prep for your procedure, you may not have a normal bowel movement for a few days.  Please Note:  You might notice some irritation and congestion in your nose or some drainage.  This is from the oxygen used during your procedure.  There is no need for concern and it should clear up in a day or so.  SYMPTOMS TO REPORT IMMEDIATELY:   Following lower endoscopy (colonoscopy or flexible sigmoidoscopy):  Excessive amounts of blood in the stool  Significant tenderness or worsening of abdominal pains  Swelling of the abdomen that is new, acute  Fever of 100F or higher   For urgent or emergent issues, a gastroenterologist can be reached at any hour by calling [phone].   DIET:  We do recommend a small meal at first, but then you may proceed to your regular diet.  Drink plenty of fluids but you should avoid alcoholic beverages for 24 hours.  ACTIVITY:  You should plan to take it easy for the rest of today and you should NOT DRIVE or use heavy machinery until tomorrow (because of the sedation medicines used during the test).     FOLLOW UP: Our staff will call the number listed on your records the next business day following your procedure to check on you and address any questions or concerns that you may have regarding the information given to you following your procedure. If we do not reach you, we will leave a message.  However, if you are feeling well and you are not experiencing any problems, there is no need to return our call.  We will assume that you have returned to your regular daily activities without incident.  If any biopsies were taken you will be contacted by phone or by letter within the next 1-3 weeks.  Please call us at [phone] if you have not heard about the biopsies in 3 weeks.    SIGNATURES/CONFIDENTIALITY: You and/or your care partner have signed paperwork which will be entered into your electronic medical record.  These signatures attest to the fact that that the information above on your After Visit Summary has been reviewed and is understood.  Full responsibility of the confidentiality of this discharge information lies with you and/or your care-partner.  Polyp,diverticulosis, high fiber diet and hemmorrhoid information given.

## 2017-10-15 NOTE — Progress Notes (Signed)
Called to room to assist during endoscopic procedure.  Patient ID and intended procedure confirmed with present staff. Received instructions for my participation in the procedure from the performing physician.  

## 2017-10-15 NOTE — Progress Notes (Signed)
Pt's states no medical or surgical changes since previsit or office visit. 

## 2017-10-15 NOTE — Op Note (Signed)
[facility] Patient Name: Sandra Wyatt Procedure Date: 10/15/2017 9:25 AM MRN: [id number] Endoscopist: Docia Chuck. Henrene Pastor , MD Age: 61 Referring MD:  Date of Birth: 1956-08-27 Gender: Female Account #: [id number] Procedure:                Colonoscopy, with cold snare polypectomy x 2 Indications:              Screening for colorectal malignant neoplasm.                            Negative index exam 2008 Medicines:                Monitored Anesthesia Care Procedure:                Pre-Anesthesia Assessment:                           - Prior to the procedure, a History and Physical                            was performed, and patient medications and                            allergies were reviewed. The patient's tolerance of                            previous anesthesia was also reviewed. The risks                            and benefits of the procedure and the sedation                            options and risks were discussed with the patient.                            All questions were answered, and informed consent                            was obtained. Prior Anticoagulants: The patient has                            taken no previous anticoagulant or antiplatelet                            agents. ASA Grade Assessment: II - A patient with                            mild systemic disease. After reviewing the risks                            and benefits, the patient was deemed in                            satisfactory condition to undergo the procedure.  After obtaining informed consent, the colonoscope                            was passed under direct vision. Throughout the                            procedure, the patient's blood pressure, pulse, and                            oxygen saturations were monitored continuously. The                            Colonoscope was introduced through the anus and                            advanced  to the the cecum, identified by                            appendiceal orifice and ileocecal valve. The                            ileocecal valve, appendiceal orifice, and rectum                            were photographed. The quality of the bowel                            preparation was excellent. The colonoscopy was                            performed without difficulty. The patient tolerated                            the procedure well. The bowel preparation used was                            SUPREP. Scope In: 9:33:59 AM Scope Out: 9:53:21 AM Scope Withdrawal Time: 0 hours 13 minutes 58 seconds  Total Procedure Duration: 0 hours 19 minutes 22 seconds  Findings:                 Two sessile polyps were found in the proximal                            transverse colon. The polyps were 1 to 2 mm in                            size. These polyps were removed with a cold snare.                            Resection and retrieval were complete.                           Multiple small and large-mouthed diverticula were  found in the left colon and right colon.                           Internal hemorrhoids were found during                            retroflexion. The hemorrhoids were small.                           The exam was otherwise without abnormality on                            direct and retroflexion views. Complications:            No immediate complications. Estimated blood loss:                            None. Estimated Blood Loss:     Estimated blood loss: none. Impression:               - Two 1 to 2 mm polyps in the proximal transverse                            colon, removed with a cold snare. Resected and                            retrieved.                           - Diverticulosis in the left colon and in the right                            colon.                           - Internal hemorrhoids.                           - The  examination was otherwise normal on direct                            and retroflexion views. Recommendation:           - Repeat colonoscopy in 5-10 years for surveillance.                           - Patient has a contact number available for                            emergencies. The signs and symptoms of potential                            delayed complications were discussed with the                            patient. Return to normal activities tomorrow.  Written discharge instructions were provided to the                            patient.                           - Resume previous diet.                           - Continue present medications.                           - Await pathology results. Docia Chuck. Henrene Pastor, MD 10/15/2017 9:58:16 AM This report has been signed electronically.

## 2017-10-16 ENCOUNTER — Telehealth: Payer: Self-pay

## 2017-10-16 NOTE — Telephone Encounter (Signed)
  Follow up Call-  Call back number 10/15/2017  Post procedure Call Back phone  # [phone]  Permission to leave phone message Yes  Some recent data might be hidden     Patient questions:  Do you have a fever, pain , or abdominal swelling? No. Pain Score  0 *  Have you tolerated food without any problems? Yes.    Have you been able to return to your normal activities? Yes.    Do you have any questions about your discharge instructions: Diet   No. Medications  No. Follow up visit  No.  Do you have questions or concerns about your Care? No.  Actions: * If pain score is 4 or above: No action needed, pain <4.

## 2017-10-20 ENCOUNTER — Encounter: Payer: Self-pay | Admitting: Internal Medicine

## 2018-02-09 IMAGING — MG 2D DIGITAL SCREENING BILATERAL MAMMOGRAM WITH CAD AND ADJUNCT TO
8 of 12 series · 8 of 28 positions shown · non-contrast
Comparison: Previous exam(s).

CLINICAL DATA: Screening.

EXAM:
2D DIGITAL SCREENING BILATERAL MAMMOGRAM WITH CAD AND ADJUNCT TOMO

[L MLO]
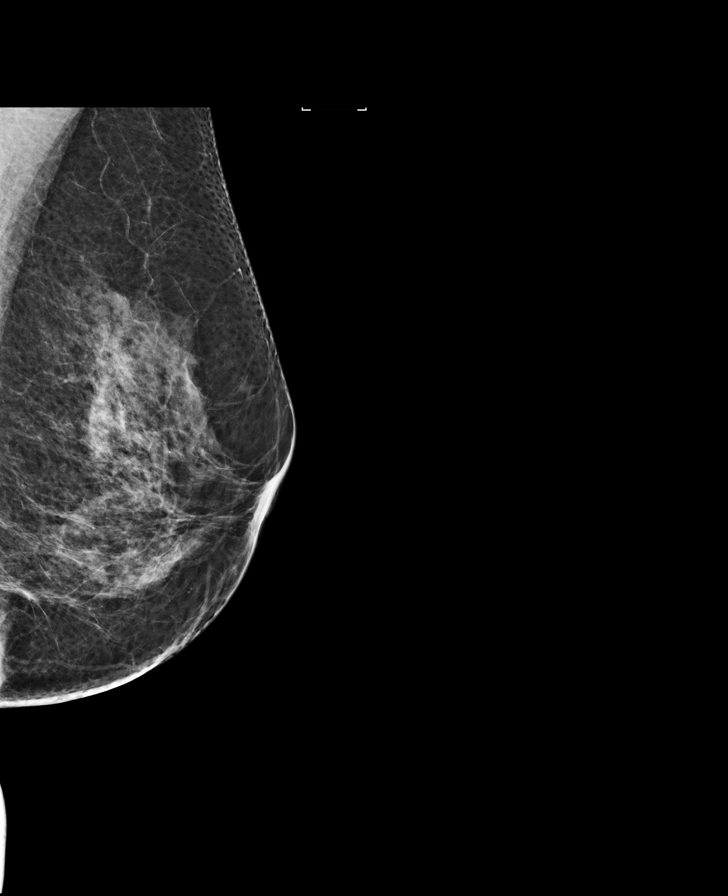

[L CC]
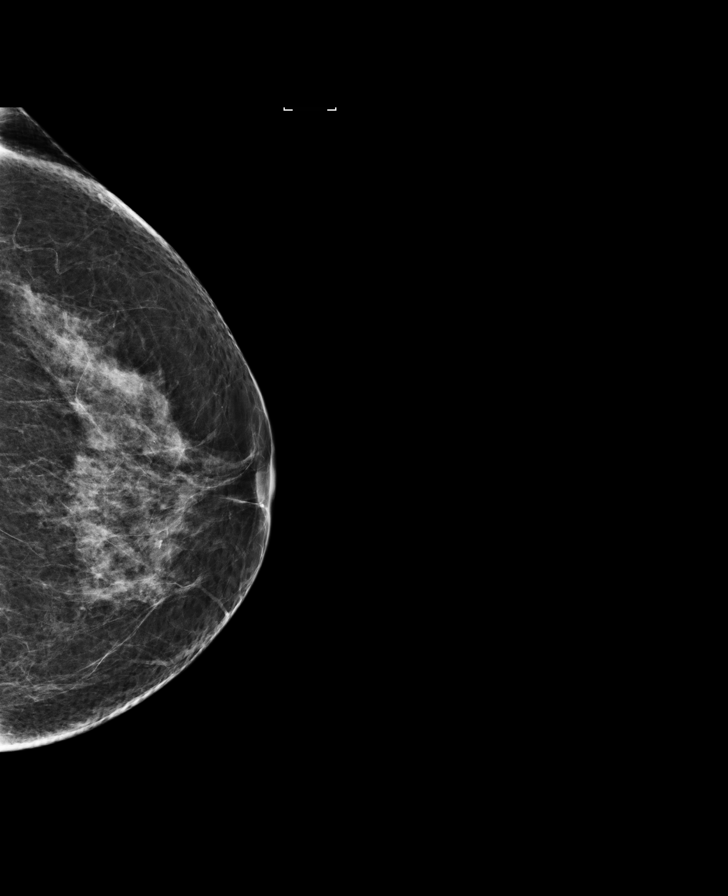

[R MLO]
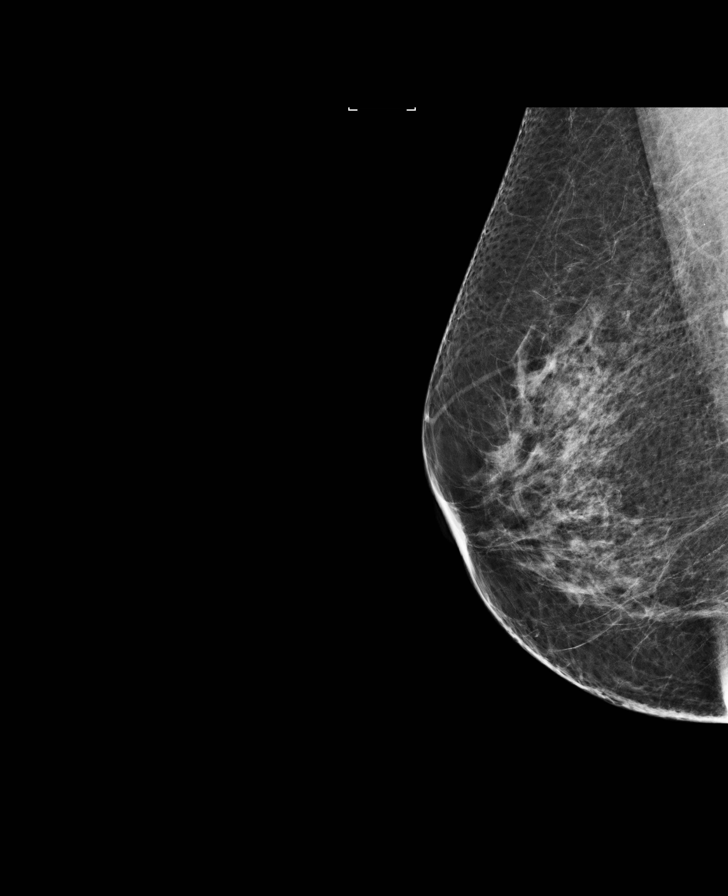

[L CC synth-2D]
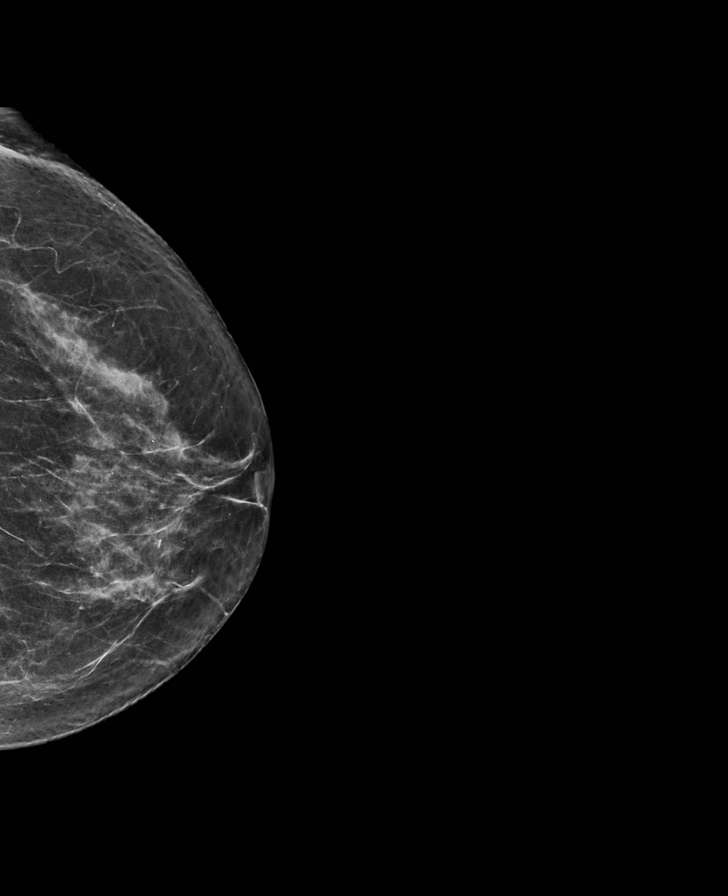

[R CC]
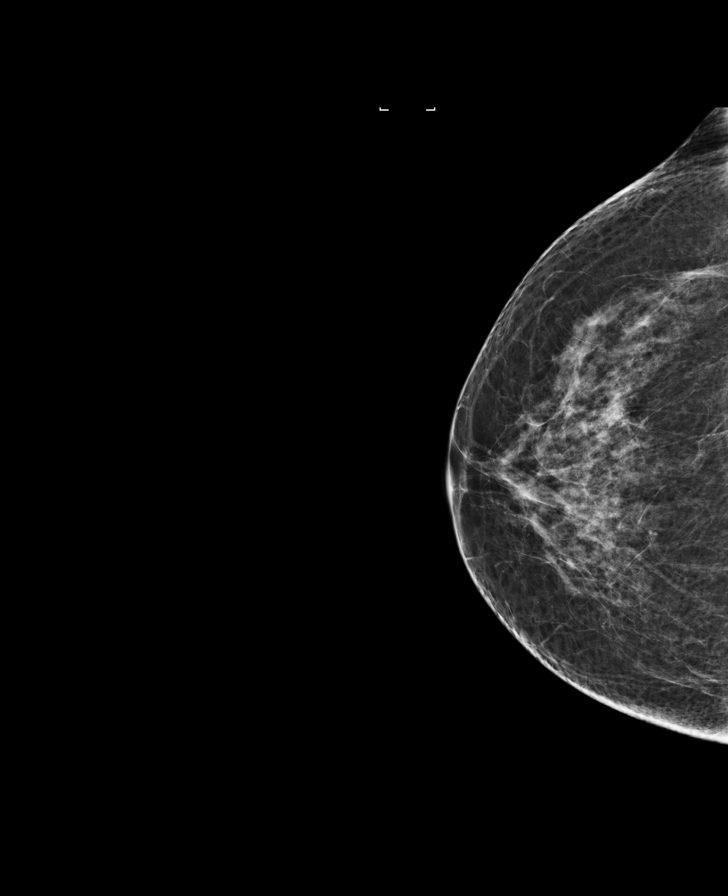

[R CC synth-2D]
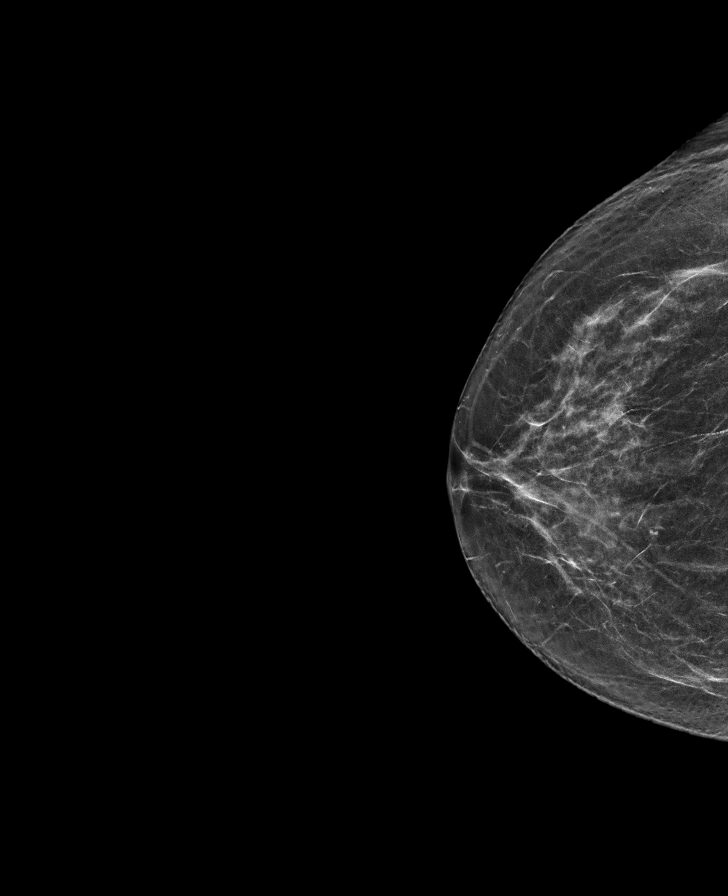

[L MLO synth-2D]
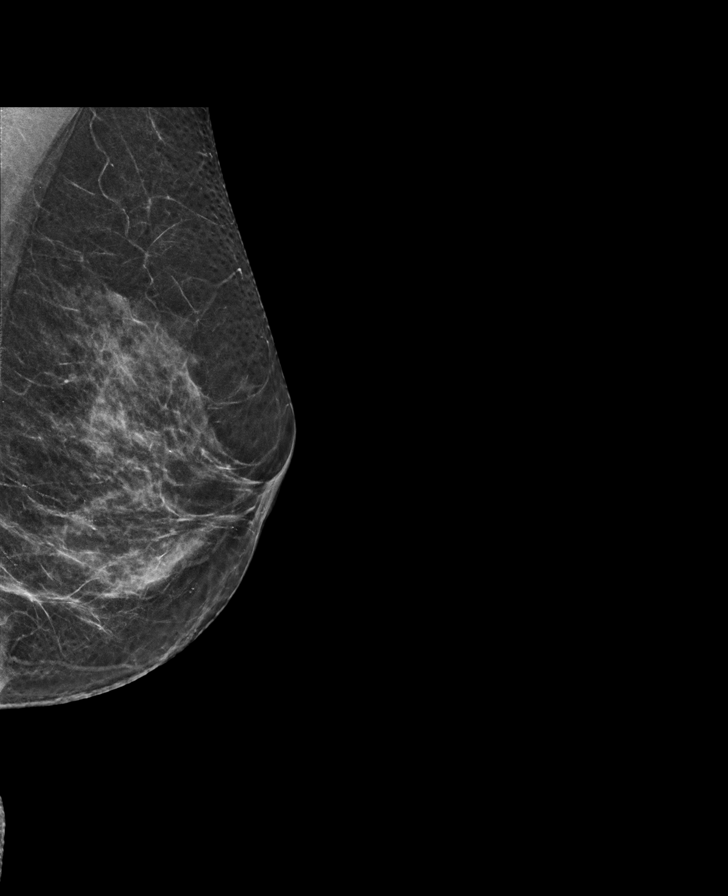

[R MLO synth-2D]
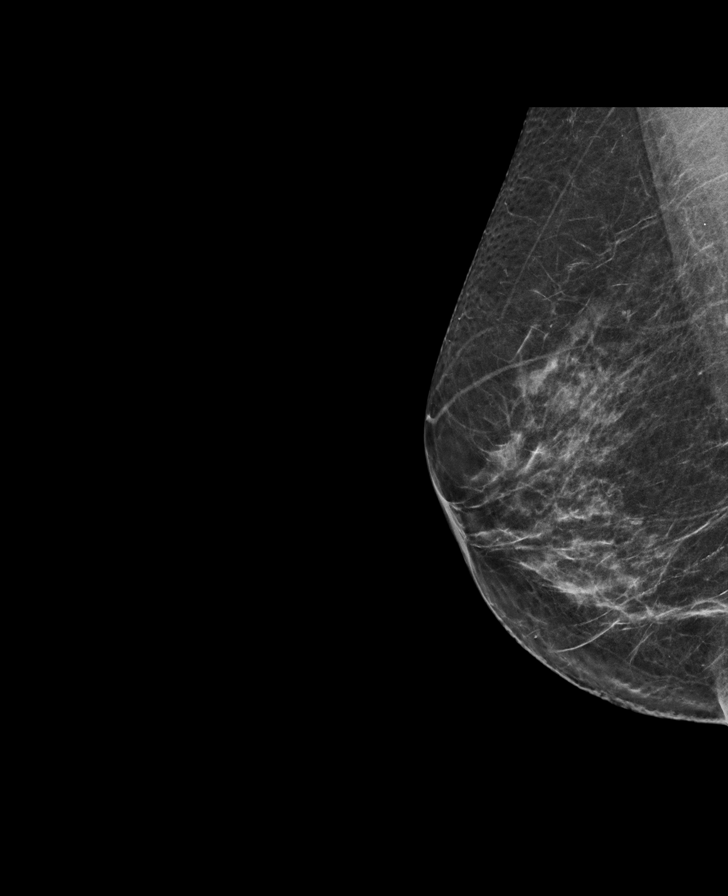

[8 of 28 positions shown; findings below may reference images not displayed]

ACR Breast Density Category c: The breast tissue is heterogeneously
dense, which may obscure small masses.
FINDINGS: There are no findings suspicious for malignancy. Images were
processed with CAD.
IMPRESSION: No mammographic evidence of malignancy. A result letter of this
screening mammogram will be mailed directly to the patient.

RECOMMENDATION:
Screening mammogram in one year. (Code:TN-[id number])

BI-RADS CATEGORY  1: Negative.

## 2018-05-06 DIAGNOSIS — J029 Acute pharyngitis, unspecified: Secondary | ICD-10-CM | POA: Diagnosis not present

## 2018-05-06 DIAGNOSIS — J069 Acute upper respiratory infection, unspecified: Secondary | ICD-10-CM | POA: Diagnosis not present

## 2018-05-06 DIAGNOSIS — R05 Cough: Secondary | ICD-10-CM | POA: Diagnosis not present

## 2018-05-06 DIAGNOSIS — Z6826 Body mass index (BMI) 26.0-26.9, adult: Secondary | ICD-10-CM | POA: Diagnosis not present

## 2018-07-27 DIAGNOSIS — E038 Other specified hypothyroidism: Secondary | ICD-10-CM | POA: Diagnosis not present

## 2018-07-27 DIAGNOSIS — N390 Urinary tract infection, site not specified: Secondary | ICD-10-CM | POA: Diagnosis not present

## 2018-07-27 DIAGNOSIS — Z0000 Encounter for general adult medical examination without abnormal findings: Secondary | ICD-10-CM | POA: Diagnosis not present

## 2018-07-27 DIAGNOSIS — E559 Vitamin D deficiency, unspecified: Secondary | ICD-10-CM | POA: Diagnosis not present

## 2018-07-30 DIAGNOSIS — Z0000 Encounter for general adult medical examination without abnormal findings: Secondary | ICD-10-CM | POA: Diagnosis not present

## 2018-07-30 DIAGNOSIS — Z1389 Encounter for screening for other disorder: Secondary | ICD-10-CM | POA: Diagnosis not present

## 2018-07-30 DIAGNOSIS — E559 Vitamin D deficiency, unspecified: Secondary | ICD-10-CM | POA: Diagnosis not present

## 2018-07-30 DIAGNOSIS — E063 Autoimmune thyroiditis: Secondary | ICD-10-CM | POA: Diagnosis not present

## 2018-07-30 DIAGNOSIS — E038 Other specified hypothyroidism: Secondary | ICD-10-CM | POA: Diagnosis not present

## 2019-01-26 DIAGNOSIS — H04123 Dry eye syndrome of bilateral lacrimal glands: Secondary | ICD-10-CM | POA: Diagnosis not present

## 2019-01-26 DIAGNOSIS — M25511 Pain in right shoulder: Secondary | ICD-10-CM | POA: Diagnosis not present

## 2019-01-26 DIAGNOSIS — H10413 Chronic giant papillary conjunctivitis, bilateral: Secondary | ICD-10-CM | POA: Diagnosis not present

## 2019-01-26 DIAGNOSIS — Z6827 Body mass index (BMI) 27.0-27.9, adult: Secondary | ICD-10-CM | POA: Diagnosis not present

## 2019-01-26 DIAGNOSIS — H2513 Age-related nuclear cataract, bilateral: Secondary | ICD-10-CM | POA: Diagnosis not present

## 2019-02-10 ENCOUNTER — Ambulatory Visit (INDEPENDENT_AMBULATORY_CARE_PROVIDER_SITE_OTHER): Payer: BLUE CROSS/BLUE SHIELD | Admitting: Orthopaedic Surgery

## 2019-02-10 ENCOUNTER — Encounter (INDEPENDENT_AMBULATORY_CARE_PROVIDER_SITE_OTHER): Payer: Self-pay | Admitting: Orthopaedic Surgery

## 2019-02-10 ENCOUNTER — Ambulatory Visit (INDEPENDENT_AMBULATORY_CARE_PROVIDER_SITE_OTHER): Payer: BLUE CROSS/BLUE SHIELD

## 2019-02-10 DIAGNOSIS — M7541 Impingement syndrome of right shoulder: Secondary | ICD-10-CM

## 2019-02-10 DIAGNOSIS — M25511 Pain in right shoulder: Secondary | ICD-10-CM

## 2019-02-10 DIAGNOSIS — G8929 Other chronic pain: Secondary | ICD-10-CM

## 2019-02-10 MED ORDER — LIDOCAINE HCL 1 % IJ SOLN
3.0000 mL | INTRAMUSCULAR | Status: AC | PRN
  Administered 2019-02-10: 3 mL

## 2019-02-10 MED ORDER — METHYLPREDNISOLONE ACETATE 40 MG/ML IJ SUSP
40.0000 mg | INTRAMUSCULAR | Status: AC | PRN
  Administered 2019-02-10: 40 mg via INTRA_ARTICULAR

## 2019-02-10 NOTE — Progress Notes (Signed)
Office Visit Note   Patient: Sandra Wyatt           Date of Birth: [birth year]           MRN: [id number] Visit Date: 02/10/2019              Requested by: Haywood Pao, MD [address] [place], [place] [address] PCP: Haywood Pao, MD   Assessment & Plan: Visit Diagnoses:  1. Chronic right shoulder pain   2. Impingement syndrome of right shoulder     Plan: I do feel that she is developed significant and severe shoulder impingement syndrome of the right shoulder.  I recommended a steroid injection in the right shoulder subacromial outlet and explained the risk and benefits of injections.  She absolutely wanted to try this and tolerated it well.  Also gave her prescription for outpatient physical therapy due to her lack of mobility of the shoulder because I think she needs to be pushed long from an outpatient therapy standpoint to get her moving better.  All question concerns were answered and addressed.  I would like to reevaluate her in 4 weeks.  Follow-Up Instructions: Return in about 4 weeks (around 03/10/2019).   Orders:  Orders Placed This Encounter  Procedures  . Large Joint Inj  . XR Shoulder Right   No orders of the defined types were placed in this encounter.     Procedures: Large Joint Inj: R subacromial bursa on 02/10/2019 9:42 AM Indications: pain and diagnostic evaluation Details: 22 G 1.5 in needle  Arthrogram: No  Medications: 3 mL lidocaine 1 %; 40 mg methylPREDNISolone acetate 40 MG/ML Outcome: tolerated well, no immediate complications Procedure, treatment alternatives, risks and benefits explained, specific risks discussed. Consent was given by the patient. Immediately prior to procedure a time out was called to verify the correct patient, procedure, equipment, support staff and site/side marked as required. Patient was prepped and draped in the usual sterile fashion.       Clinical Data: No additional findings.   Subjective: Chief  Complaint  Patient presents with  . Right Shoulder - Pain  The patient is listed a new patient today since I have not seen her since 2016.  At that visit it was for a left shoulder and scapular issue.  An injection helped her greatly.  She is been dealing with right shoulder pain now with no known injury for the last almost 5 months now.  It started to wake her up at night.  It hurts with overhead activities and reaching behind her.  She is developed a lot of stiffness in that shoulder as well.  She denies any numbness and tingling in her right hand.  She denies any neck issues.  She is struggling with activities daily living as a relates to dealing with decreased mobility of her right shoulder.  She is not a diabetic.  Again she has not injured the shoulder before.  HPI  Review of Systems She currently denies any headache, chest pain, shortness of breath, fever, chills, nausea, vomiting.  Objective: Vital Signs: There were no vitals taken for this visit.  Physical Exam She is alert and orient x3 and in no acute distress Ortho Exam Examination of her right shoulder shows significant limitations with internal rotation and adduction.  She also cannot fully abduct or forward flex her shoulder secondary to stiffness.  Her rotator cuff though appears to be intact.  Her external rotation is normal. Specialty Comments:  No specialty comments available.  Imaging: Xr Shoulder Right  Result Date: 02/10/2019 3 views of the right shoulder show no acute findings.  The shoulder is well located at the glenohumeral joint.  There is adequate space in the subacromial outlet.    PMFS History: Patient Active Problem List   Diagnosis Date Noted  . Impingement syndrome of right shoulder 02/10/2019  . 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 Other fatigue 03/03/2017  . Family history of MS (multiple sclerosis) 03/03/2017  . Frequent UTI 03/03/2017   Past Medical History:  Diagnosis Date  . Thyroid disease   .  Vision abnormalities     Family History  Problem Relation Age of Onset  . Stroke Mother   . Heart disease Mother   . Diabetes Father   . Migraines Sister   . Fibromyalgia Sister   . Multiple sclerosis Brother   . Lymphoma Sister   . Renal Disease Sister   . Multiple sclerosis Other   . HIV/AIDS Brother   . Breast cancer Neg Hx   . Colon cancer Neg Hx   . Esophageal cancer Neg Hx   . Stomach cancer Neg Hx   . Rectal cancer Neg Hx     Past Surgical History:  Procedure Laterality Date  . TONSILLECTOMY    . WISDOM TOOTH EXTRACTION     Social History   Occupational History  . Not on file  Tobacco Use  . Smoking status: Never Smoker  . Smokeless tobacco: Never Used  Substance and Sexual Activity  . Alcohol use: Yes    Comment: rare  . Drug use: No  . Sexual activity: Not on file

## 2019-03-16 ENCOUNTER — Ambulatory Visit (INDEPENDENT_AMBULATORY_CARE_PROVIDER_SITE_OTHER): Payer: BLUE CROSS/BLUE SHIELD | Admitting: Orthopaedic Surgery

## 2019-06-25 DIAGNOSIS — L298 Other pruritus: Secondary | ICD-10-CM | POA: Diagnosis not present

## 2019-06-25 DIAGNOSIS — L237 Allergic contact dermatitis due to plants, except food: Secondary | ICD-10-CM | POA: Diagnosis not present

## 2019-07-28 DIAGNOSIS — E038 Other specified hypothyroidism: Secondary | ICD-10-CM | POA: Diagnosis not present

## 2019-07-28 DIAGNOSIS — E559 Vitamin D deficiency, unspecified: Secondary | ICD-10-CM | POA: Diagnosis not present

## 2019-07-28 DIAGNOSIS — Z0000 Encounter for general adult medical examination without abnormal findings: Secondary | ICD-10-CM | POA: Diagnosis not present

## 2019-08-03 DIAGNOSIS — E063 Autoimmune thyroiditis: Secondary | ICD-10-CM | POA: Diagnosis not present

## 2019-08-03 DIAGNOSIS — E038 Other specified hypothyroidism: Secondary | ICD-10-CM | POA: Diagnosis not present

## 2019-08-03 DIAGNOSIS — M859 Disorder of bone density and structure, unspecified: Secondary | ICD-10-CM | POA: Diagnosis not present

## 2019-08-03 DIAGNOSIS — R82998 Other abnormal findings in urine: Secondary | ICD-10-CM | POA: Diagnosis not present

## 2019-08-03 DIAGNOSIS — Z23 Encounter for immunization: Secondary | ICD-10-CM | POA: Diagnosis not present

## 2019-08-03 DIAGNOSIS — Z0000 Encounter for general adult medical examination without abnormal findings: Secondary | ICD-10-CM | POA: Diagnosis not present

## 2019-08-03 DIAGNOSIS — E559 Vitamin D deficiency, unspecified: Secondary | ICD-10-CM | POA: Diagnosis not present

## 2019-12-28 DIAGNOSIS — R6883 Chills (without fever): Secondary | ICD-10-CM | POA: Diagnosis not present

## 2019-12-28 DIAGNOSIS — R6889 Other general symptoms and signs: Secondary | ICD-10-CM | POA: Diagnosis not present

## 2019-12-28 DIAGNOSIS — Z20822 Contact with and (suspected) exposure to covid-19: Secondary | ICD-10-CM | POA: Diagnosis not present

## 2020-01-03 DIAGNOSIS — F418 Other specified anxiety disorders: Secondary | ICD-10-CM | POA: Diagnosis not present

## 2020-01-03 DIAGNOSIS — F419 Anxiety disorder, unspecified: Secondary | ICD-10-CM | POA: Insufficient documentation

## 2020-01-03 DIAGNOSIS — J0100 Acute maxillary sinusitis, unspecified: Secondary | ICD-10-CM | POA: Diagnosis not present

## 2020-01-03 DIAGNOSIS — E038 Other specified hypothyroidism: Secondary | ICD-10-CM | POA: Diagnosis not present

## 2020-05-05 ENCOUNTER — Other Ambulatory Visit: Payer: Self-pay | Admitting: Internal Medicine

## 2020-05-05 DIAGNOSIS — Z1231 Encounter for screening mammogram for malignant neoplasm of breast: Secondary | ICD-10-CM

## 2021-05-29 ENCOUNTER — Other Ambulatory Visit: Payer: Self-pay

## 2021-05-29 ENCOUNTER — Ambulatory Visit: Payer: Self-pay

## 2021-05-29 ENCOUNTER — Ambulatory Visit (INDEPENDENT_AMBULATORY_CARE_PROVIDER_SITE_OTHER): Payer: 59 | Admitting: Orthopaedic Surgery

## 2021-05-29 DIAGNOSIS — M25512 Pain in left shoulder: Secondary | ICD-10-CM | POA: Diagnosis not present

## 2021-05-29 DIAGNOSIS — M2550 Pain in unspecified joint: Secondary | ICD-10-CM

## 2021-05-29 DIAGNOSIS — G8929 Other chronic pain: Secondary | ICD-10-CM | POA: Diagnosis not present

## 2021-05-29 MED ORDER — MELOXICAM 7.5 MG PO TABS: 7.5000 mg | ORAL_TABLET | Freq: Two times a day (BID) | ORAL | 1 refills | Status: DC | PRN

## 2021-05-29 MED ORDER — METHYLPREDNISOLONE ACETATE 40 MG/ML IJ SUSP
40.0000 mg | INTRAMUSCULAR | Status: AC | PRN
  Administered 2021-05-29: 40 mg via INTRA_ARTICULAR

## 2021-05-29 MED ORDER — LIDOCAINE HCL 1 % IJ SOLN
3.0000 mL | INTRAMUSCULAR | Status: AC | PRN
  Administered 2021-05-29: 3 mL

## 2021-05-29 NOTE — Progress Notes (Signed)
Office Visit Note   Patient: Sandra Wyatt           Date of Birth: 09/14/1956           MRN: [id number] Visit Date: 05/29/2021              Requested by: Haywood Pao, MD [address] [place],  [place] [address] PCP: Haywood Pao, MD   Assessment & Plan: Visit Diagnoses:  1. Chronic left shoulder pain     Plan: Since a steroid injection and a home exercise and stretching program did well for her right shoulder, organ to try this for her left shoulder today and she agrees with this treatment plan.  I placed a steroid injection in the subacromial outlet which she tolerated well.  We will try meloxicam as an anti-inflammatory and I would like to see her back in 4 weeks to see how she is doing overall.  I do feel it is appropriate to refer her to rheumatology for a full rheumatologic work-up and evaluation given her multiple joint complaints and this rash that she keeps developing near her joints.  Also given the fact that she has seen dermatology on several occasions and they have not been able to, with a diagnosis for this.  All questions and concerns were answered and addressed.  I will see her back myself in 4 weeks.  Follow-Up Instructions: Return in about 4 weeks (around 06/26/2021).   Orders:  Orders Placed This Encounter  Procedures   Large Joint Inj   XR Shoulder Left   Meds ordered this encounter  Medications   meloxicam (MOBIC) 7.5 MG tablet    Sig: Take 1 tablet (7.5 mg total) by mouth 2 (two) times daily as needed for pain.    Dispense:  60 tablet    Refill:  1       Procedures: Large Joint Inj: L subacromial bursa on 05/29/2021 9:06 AM Indications: pain and diagnostic evaluation Details: 22 G 1.5 in needle  Arthrogram: No  Medications: 3 mL lidocaine 1 %; 40 mg methylPREDNISolone acetate 40 MG/ML Outcome: tolerated well, no immediate complications Procedure, treatment alternatives, risks and benefits explained, specific risks discussed. Consent  was given by the patient. Immediately prior to procedure a time out was called to verify the correct patient, procedure, equipment, support staff and site/side marked as required. Patient was prepped and draped in the usual sterile fashion.      Clinical Data: No additional findings.   Subjective: Chief Complaint  Patient presents with   Left Shoulder - Pain  The patient is a very pleasant 65 year old female that I have actually seen before for right shoulder impingement syndrome.  At that time we placed a steroid injection in her right shoulder subacromial space and sent her to physical therapy.  However she was unable to go to physical therapy because that is when the COVID-19 pandemic hit hard.  She was able to get her motion back on her own and she is doing well with that right shoulder.  She is now developed left shoulder pain and stiffness for over 2 months now.  She describes a constant ache.  She denies any injury.  She says it is painful to lift her arm up overhead or reach behind her similar to what happened on the right side several years ago.  She does report multiple joint complaints and has had an ongoing rash.  She seen a dermatologist on multiple occasions.  There is  a family history of some type of sarcoid involving the skin a 1 sister in the lungs on another sister.  She is not a diabetic either.  She has taken Advil for the pain in her shoulder.  HPI  Review of Systems There is currently listed no headache, chest pain, shortness of breath, fever, chills, nausea, vomiting  Objective: Vital Signs: There were no vitals taken for this visit.  Physical Exam She is alert and oriented x3 and in no acute distress Ortho Exam Examination of her right shoulder shows full and fluid range of motion no weakness at all.  Examination of the left shoulder shows positive signs of impingement.  Her internal rotation with adduction is significant limited in her abduction and overhead forward  flexion is all very painful to her.  The shoulder is clinically well located.  There is no significant weakness in the rotator cuff. Specialty Comments:  No specialty comments available.  Imaging: XR Shoulder Left  Result Date: 05/29/2021 3 views the left shoulder show no acute findings.  The shoulder is well located in the subacromial outlet shows only slight narrowing.    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   Other fatigue 03/03/2017   Family history of MS (multiple sclerosis) 03/03/2017   Frequent UTI 03/03/2017   Past Medical History:  Diagnosis Date   Thyroid disease    Vision abnormalities     Family History  Problem Relation Age of Onset   Stroke Mother    Heart disease Mother    Diabetes Father    Migraines Sister    Fibromyalgia Sister    Multiple sclerosis Brother    Lymphoma Sister    Renal Disease Sister    Multiple sclerosis Other    HIV/AIDS Brother    Breast cancer Neg Hx    Colon cancer Neg Hx    Esophageal cancer Neg Hx    Stomach cancer Neg Hx    Rectal cancer Neg Hx     Past Surgical History:  Procedure Laterality Date   TONSILLECTOMY     WISDOM TOOTH EXTRACTION     Social History   Occupational History   Not on file  Tobacco Use   Smoking status: Never   Smokeless tobacco: Never  Vaping Use   Vaping Use: Never used  Substance and Sexual Activity   Alcohol use: Yes    Comment: rare   Drug use: No   Sexual activity: Not on file        ++

## 2021-07-02 ENCOUNTER — Ambulatory Visit: Payer: 59 | Admitting: Orthopaedic Surgery

## 2021-07-09 NOTE — Progress Notes (Signed)
Office Visit Note  Patient: Sandra Wyatt             Date of Birth: 1956-10-19           MRN: [id number]             PCP: Haywood Pao, MD Referring: Mcarthur Rossetti* Visit Date: 07/10/2021   Subjective:  History of Present Illness: Sandra Wyatt is a 65 y.o. female here for chronic joint pain of multiple sites and recurrent skin rashes particularly around her joints. She sees Dr. Ninfa Linden for shoulder impingement syndrome well managed with subacromial bursa injection and physical therapy previously for the right shoulder and 2020 and more recently had left shoulder injection currently making some progress with improvement of range of motion and pain.  Outside of the shoulder problems she has chronic pain intermittently in several other areas particularly in the hands knees and ankles bilaterally with very intermittent elbow pain.  She reports pain in hands and ankles first thing in the morning she typically relieves this by taking a hot bath or shower that improved symptoms noticeably.  She does not notice any erythema or warmth sometimes feels there is swelling in the affected areas.  She does not take any medication chronically she has had very good improvement with the intra-articular injections.  She does not have significant amounts of low back pain or back morning stiffness. Skin rashes are chronic usually erythematous and itchy though more recently she experienced more raised lesions that were painful down the left arm felt by her dermatologist to represent shingles.  She denies having had biopsy or culture of the lesions.  She has had multiple excisions and topical cryotherapy for actinic keratoses and seborrheic keratoses. She has had multiple tick bites previously lived in [place] but no recent travel to that area.  2 recent bites 1 on abdomen 1 in the inguinal region did not undergo further testing or prophylaxis due to reported low attachment time and not endemic  region. She has hypothyroidism secondary to Hashimoto's thyroiditis on long-term supplementation.  She has positional tremor for decades worse in the left hand and is exacerbated with drinking coffee.   Labs reviewed 05/2017 Lyme Abs neg RMSF neg  Imaging reviewed 05/29/21 Xray left shoulder 3 views the left shoulder show no acute findings.  The shoulder is well located in the subacromial outlet shows only slight narrowing.  02/10/19 Xray right shoulder 3 views of the right shoulder show no acute findings.  The shoulder is well located at the glenohumeral joint.  There is adequate space in the subacromial outlet.   Activities of Daily Living:  Patient reports morning stiffness for 20 minutes.   Patient Reports nocturnal pain.  Difficulty dressing/grooming: Reports Difficulty climbing stairs: Denies Difficulty getting out of chair: Denies Difficulty using hands for taps, buttons, cutlery, and/or writing: Reports  Review of Systems  Constitutional:  Positive for fatigue.  HENT:  Negative for mouth sores, mouth dryness and nose dryness.   Eyes:  Positive for visual disturbance and dryness. Negative for pain and itching.  Respiratory:  Negative for cough, hemoptysis, shortness of breath and difficulty breathing.   Cardiovascular:  Negative for chest pain, palpitations and swelling in legs/feet.  Gastrointestinal:  Positive for constipation and diarrhea. Negative for abdominal pain and blood in stool.  Endocrine: Negative for increased urination.  Genitourinary:  Negative for painful urination.  Musculoskeletal:  Positive for joint pain, joint pain, joint swelling, myalgias, morning stiffness and myalgias.  Negative for muscle weakness and muscle tenderness.  Skin:  Positive for rash. Negative for color change and redness.  Allergic/Immunologic: Negative for susceptible to infections.  Neurological:  Negative for dizziness, numbness, headaches, memory loss and weakness.  Hematological:   Negative for swollen glands.  Psychiatric/Behavioral:  Negative for confusion and sleep disturbance.    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   Hypothyroidism 02/15/2014   Vitamin D deficiency 12/02/2012   Fibrocystic breast changes 09/13/2010   Disorder of bone 09/07/2009   Encounter for general adult medical examination without abnormal findings 09/07/2009   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    Past Medical History:  Diagnosis Date   Thyroid disease    Vision abnormalities     Family History  Problem Relation Age of Onset   Stroke Mother    Heart disease Mother    Stroke Father    Diabetes Father    Migraines Sister    Fibromyalgia Sister    Sarcoidosis Sister    Sarcoidosis Sister    Lymphoma Sister    Renal Disease Sister    HIV/AIDS Brother    Healthy Son    Healthy Daughter    Breast cancer Neg Hx    Colon cancer Neg Hx    Esophageal cancer Neg Hx    Stomach cancer Neg Hx    Rectal cancer Neg Hx    Past Surgical History:  Procedure Laterality Date   TONSILLECTOMY     WISDOM TOOTH EXTRACTION     Social History   Social History Narrative   Not on file   Immunization History  Administered Date(s) Administered   DTaP, 5 pertussis antigens 07/23/2017   Influenza Split 09/27/2011   Influenza, Quadrivalent, Recombinant, Inj, Pf 08/03/2019   Influenza,inj,Quad PF,6+ Mos 08/16/2015   PFIZER(Purple Top)SARS-COV-2 Vaccination 06/23/2020, 07/15/2020   Tdap 06/15/2008, 06/25/2008     Objective: Vital  Signs: BP 129/87 (BP Location: Right Arm, Patient Position: Sitting, Cuff Size: Normal)   Pulse 71   Ht 5' 5.75" (1.67 m)   Wt 161 lb 6.4 oz (73.2 kg)   BMI 26.25 kg/m    Physical Exam HENT:     Right Ear: External ear normal.     Left Ear: External ear normal.     Mouth/Throat:     Mouth: Mucous membranes are moist.     Pharynx: Oropharynx is clear.  Eyes:     Conjunctiva/sclera: Conjunctivae normal.  Cardiovascular:     Rate and Rhythm: Normal rate and regular rhythm.  Pulmonary:     Effort: Pulmonary effort is normal.     Breath sounds: Normal breath sounds.  Skin:    General: Skin is warm and dry.     Comments: No alopecia no scalp or facial rash Numerous solar lentigines on trunk and extremities Skin appears mildly dry and rough throughout extremities, round flat hyperpigmented spots over bilateral ankles without warmth or tenderness or induration No digital or nail pitting   Neurological:     General: No focal deficit present.     Mental  Status: She is alert.     Comments: Positional tremor worst in left hand  Psychiatric:        Mood and Affect: Mood normal.     Musculoskeletal Exam:  Neck with full range of motion and nontender Right shoulder normal range of motion left shoulder is decreased in abduction forward flexion and external rotation with tenderness and guarding Elbows wrists and fingers with normal range of motion no tenderness or swelling, Heberden's nodes present at PIPs and DIPs bilateral hands No lumbar paraspinal muscle tenderness, right hip normal left hip with tenderness to pressure laterally and posteriorly with full range of motion Bilateral knees patellofemoral crepitus present with full range of motion no tenderness or swelling Right ankle with tenderness over anterior lateral border without palpable swelling has full range of motion, left ankle normal   Investigation: No additional findings.  Imaging: No results found.  Recent Labs: Lab  Results  Component Value Date   HGB 13.6 05/24/2017   NA 139 05/24/2017   K 3.9 05/24/2017   CL 106 05/24/2017   GLUCOSE 91 05/24/2017   BUN 14 05/24/2017   CREATININE 0.80 05/24/2017    Speciality Comments: No specialty comments available.  Procedures:  No procedures performed Allergies: Sulfa antibiotics   Assessment / Plan:     Visit Diagnoses: Arthralgia, unspecified joint  Rash and other nonspecific skin eruption - Plan: Rheumatoid factor, Cyclic citrul peptide antibody, IgG, ANA, Sedimentation rate, Tissue transglutaminase, IgA, Gliadin antibodies, serum, B. burgdorfi antibodies, IgA  Chronic joint pain in multiple areas and persistent rashes is not entirely clear if these directly correlate but the patient tends to notice more pain during periods of increased skin inflammation.  No active synovitis appreciated on exam today only osteoarthritis changes.  However given the history of morning stiffness improving with use, improvement to heat, and distribution of symptoms we will check serology including rheumatoid antibodies also ANA.  We will check TTG and gliadin antibodies though she has had normal previous screening colonoscopies.  Checking sedimentation rate for evidence of inflammatory activity. No specific DMARD treatment recommended may need further testing if significant positive ANA as well  Tick bite of abdomen, subsequent encounter - Plan: B. burgdorfi antibodies  Multiple tick bites each sounds low risk but considering these persistent skin lesions arthralgias and patient concerns will recheck for Lyme antibodies screening.  Autoimmune thyroiditis - Plan: ANA  History of Hashimoto's thyroiditis does increase incident risk for Co. a current autoimmune disorders.  Can also cause false positive ANA due to thyroperoxidase antibodies.  Vitamin D deficiency - Plan: VITAMIN D 25 Hydroxy (Vit-D Deficiency, Fractures)  History of vitamin D deficiency rechecking level today as  this can exacerbate skin disease.  Orders: Orders Placed This Encounter  Procedures   Rheumatoid factor   Cyclic citrul peptide antibody, IgG   ANA   Sedimentation rate   Tissue transglutaminase, IgA   Gliadin antibodies, serum   B. burgdorfi antibodies   IgA   VITAMIN D 25 Hydroxy (Vit-D Deficiency, Fractures)   No orders of the defined types were placed in this encounter.   Follow-Up Instructions: No follow-ups on file.   Collier Salina, MD  Note - This record has been created using [employer].  Chart creation errors have been sought, but may not always  have been located. Such creation errors do not reflect on  the standard of medical care.

## 2021-07-10 ENCOUNTER — Ambulatory Visit (INDEPENDENT_AMBULATORY_CARE_PROVIDER_SITE_OTHER): Payer: 59 | Admitting: Internal Medicine

## 2021-07-10 ENCOUNTER — Other Ambulatory Visit: Payer: Self-pay

## 2021-07-10 ENCOUNTER — Encounter: Payer: Self-pay | Admitting: Internal Medicine

## 2021-07-10 VITALS — BP 129/87 | HR 71 | Ht 65.75 in | Wt 161.4 lb

## 2021-07-10 DIAGNOSIS — S30861A Insect bite (nonvenomous) of abdominal wall, initial encounter: Secondary | ICD-10-CM | POA: Insufficient documentation

## 2021-07-10 DIAGNOSIS — S30861D Insect bite (nonvenomous) of abdominal wall, subsequent encounter: Secondary | ICD-10-CM

## 2021-07-10 DIAGNOSIS — W57XXXA Bitten or stung by nonvenomous insect and other nonvenomous arthropods, initial encounter: Secondary | ICD-10-CM | POA: Insufficient documentation

## 2021-07-10 DIAGNOSIS — M2550 Pain in unspecified joint: Secondary | ICD-10-CM | POA: Diagnosis not present

## 2021-07-10 DIAGNOSIS — E7800 Pure hypercholesterolemia, unspecified: Secondary | ICD-10-CM | POA: Insufficient documentation

## 2021-07-10 DIAGNOSIS — R21 Rash and other nonspecific skin eruption: Secondary | ICD-10-CM

## 2021-07-10 DIAGNOSIS — W57XXXD Bitten or stung by nonvenomous insect and other nonvenomous arthropods, subsequent encounter: Secondary | ICD-10-CM

## 2021-07-10 DIAGNOSIS — E559 Vitamin D deficiency, unspecified: Secondary | ICD-10-CM

## 2021-07-10 DIAGNOSIS — E063 Autoimmune thyroiditis: Secondary | ICD-10-CM

## 2021-07-10 DIAGNOSIS — M7541 Impingement syndrome of right shoulder: Secondary | ICD-10-CM

## 2021-07-10 NOTE — Patient Instructions (Signed)
Antinuclear Antibody Test Why am I having this test? This is a test that is used to help diagnose systemic lupus erythematosus (SLE) and other autoimmune diseases. An autoimmune disease is a disease in which the body's own defense (immune)system attacks its organs. What is being tested? This test checks for antinuclear antibodies (ANA) in the blood. The presence of ANA is associated with several autoimmune diseases. It is seen in almost allpatients with lupus. What kind of sample is taken?  A blood sample is required for this test. It is usually collected by insertinga needle into a blood vessel. How are the results reported? Your test results will be reported as either positive or negative. A false-positive result can occur. A false positive is incorrect because itmeans that a condition is present when it is not. What do the results mean? A positive test result may mean that you have: Lupus. Other autoimmune diseases, such as rheumatoid arthritis, scleroderma, or Sjgren syndrome. Conditions that may cause a false-positive result include: Liver dysfunction. Myasthenia gravis. Infectious mononucleosis. Talk with your health care provider about what your results mean. Questions to ask your health care provider Ask your health care provider, or the department that is doing the test: When will my results be ready? How will I get my results? What are my treatment options? What other tests do I need? What are my next steps? Summary This is a test that is used to help diagnose systemic lupus erythematosus (SLE) and other autoimmune diseases. An autoimmune disease is a disease in which the body's own defense (immune)system attacks the body. This test checks for antinuclear antibodies (ANA) in the blood. The presence of ANA is associated with several autoimmune diseases. It is seen in almost all patients with lupus. Your test results will be reported as either positive or negative. Talk with  your health care provider about what your results mean.  Rheumatoid Factor Test Why am I having this test? The rheumatoid factor test is used to help diagnose certain autoimmune diseases. Normally, your body makes protective proteins called antibodies (IgM, IgG, and IgA) to help fight off infections. If you have an autoimmune disease, your body may make a collection of antibodies that do not function correctly (autoantibodies). They attack tissues that are wrongly identified as foreign. In some autoimmunediseases, these autoantibodies are known as the rheumatoid factor (RF). You may have this test if your health care provider suspects that you have an autoimmune disease, such as: Rheumatoid arthritis (RA). Systemic lupus erythematosus (SLE). Sjgren's syndrome. Mixed connective tissue disease. A majority of people who have rheumatoid arthritis have a positive rheumatoid factor. Raised levels of these autoantibodies can also sometimes be a sign of other autoimmune diseases. However, it is also possible for the RF test to be negative even when a disease is present. Likewise, a small number of people may have a positive RF test when an autoimmune disease is not actually present.Other tests may be needed to help make a diagnosis. What is being tested? This test checks your blood for the RF autoantibodies. What kind of sample is taken?  A blood sample is required for this test. It is usually collected by insertinga needle into a blood vessel or by sticking a finger with a small needle. How are the results reported? Your test result will be reported as either positive or negative for RFautoantibodies. What do the results mean? A negative result means that no RF or only a small amount was found in yourblood. This means  that it is unlikely that you have an autoimmune disease. A positive result means that a larger amount of RF autoantibodies was found in your blood. This may indicate that you have RA or  another autoimmune disease. Your health care provider will talk to you about doing more tests to confirmyour results. Talk with your health care provider about what your results mean. Questions to ask your health care provider Ask your health care provider, or the department that is doing the test: When will my results be ready? How will I get my results? What are my treatment options? What other tests do I need? What are my next steps? Summary The rheumatoid factor test is used to help diagnose certain autoimmune diseases. In some autoimmune diseases, the body makes autoantibodies known as the rheumatoid factor (RF), which attack tissues that are wrongly identified as foreign. A negative result means that no RF or only a small amount was found in your blood. A positive result means that a larger amount of RF autoantibodies was found in your blood. Talk with your health care provider about what your results mean.  Erythrocyte Sedimentation Rate Test Why am I having this test? The erythrocyte sedimentation rate (ESR) test is used to help find illnesses related to: Sudden (acute) or long-term (chronic) infections. Inflammation. The body's disease-fighting system attacking healthy cells (autoimmune diseases). Cancer. Tissue death. If you have symptoms that may be related to any of these illnesses, your health care provider may do an ESR test before doing more specific tests. If you have an inflammatory immune disease, such as rheumatoid arthritis, you may have thistest to help monitor your therapy. What is being tested? This test measures how long it takes for your red blood cells (erythrocytes) to settle in a solution over a certain amount of time (sedimentation rate). When you have an infection or inflammation, your red blood cells clump together and settle faster. The sedimentation rate provides information abouthow much inflammation is present in the body. What kind of sample is  taken?  A blood sample is required for this test. It is usually collected by insertinga needle into a blood vessel. How do I prepare for this test? Follow any instructions from your health care provider about changing orstopping your regular medicines. Tell a health care provider about: Any allergies you have. All medicines you are taking, including vitamins, herbs, eye drops, creams, and over-the-counter medicines. Any blood disorders you have. Any surgeries you have had. Any medical conditions you have, such as thyroid or kidney disease. Whether you are pregnant or may be pregnant. How are the results reported? Your results will be reported as a value that measures sedimentation rate in millimeters per hour (mm/hr). Your health care provider will compare your results to normal ranges that were established after testing a large group of people (reference values). Reference values may vary among labs and hospitals. For this test, common reference values, which vary by age and gender, are: Newborn: 0-2 mm/hr. Child, up to puberty: 0-10 mm/hr. Female: Under 50 years: 0-20 mm/hr. 50-85 years: 0-30 mm/hr. Over 85 years: 0-42 mm/hr. Female: Under 50 years: 0-15 mm/hr. 50-85 years: 0-20 mm/hr. Over 85 years: 0-30 mm/hr. Certain conditions or medicines may cause ESR levels to be falsely lower or higher, such as: Pregnancy. Obesity. Steroids, birth control pills, and blood thinners. Thyroid or kidney disease. What do the results mean? Results that are within reference values are considered normal, meaning that the level of inflammation in your body  is healthy. High ESR levels mean that there is inflammation in your body. You will have more tests to help make adiagnosis. Inflammation may result from many different conditions or injuries. Talk with your health care provider about what your results mean. Questions to ask your health care provider Ask your health care provider, or the department  that is doing the test: When will my results be ready? How will I get my results? What are my treatment options? What other tests do I need? What are my next steps? Summary The erythrocyte sedimentation rate (ESR) test is used to help find illnesses associated with sudden (acute) or long-term (chronic) infections, inflammation, autoimmune diseases, cancer, or tissue death. If you have symptoms that may be related to any of these illnesses, your health care provider may do an ESR test before doing more specific tests. If you have an inflammatory immune disease, such as rheumatoid arthritis, you may have this test to help monitor your therapy. This test measures how long it takes for your red blood cells (erythrocytes) to settle in a solution over a certain amount of time (sedimentation rate). This provides information about how much inflammation is present in the body.  Celiac Disease Antibodies Test Why am I having this test? The celiac disease antibodies test is a blood test that is done to help diagnose celiac disease. People who have celiac disease cannot tolerate theproteins gluten and gliadin, which are found in wheat and wheat products. You may have the test if you have symptoms of celiac disease. The symptoms include: Long-term (chronic) diarrhea. Belly (abdominal) pain. Weight loss. You may also have this test if you have increased risk for celiac disease, suchas having a close family member who has the disease. What is being tested? This test examines your blood for the presence of specific antibodies that are common to celiac disease. Antibodies are proteins that your body normally makes to protect itself from germs that can make you sick. In people who have celiac disease, the body produces antibodies in response to the proteins gluten andgliadin. What kind of sample is taken?  A blood sample is required for this test. It is usually collected by insertinga needle into a blood  vessel. How do I prepare for this test? You may be asked to provide a list of foods that you have eaten in the 48 hours before the test. If you have eaten foods that contain gluten, the test will show a strong antibody response if you do have celiac disease. Check with yourhealth care provider for specific instructions. How are the results reported? Some of your test results will be reported as values in EU (European units of measurement). Your health care provider will compare your results to normal ranges that were established after testing a large group of people (reference ranges). Reference ranges may vary among labs and hospitals. For this test, common reference ranges for the three common celiac disease antibodies are as follows: Gliadin IgA and IgG: Birth to 65 years of age: less than 20 EU. 59 years of age and older: less than 25 EU. Tissue transglutaminase IgA, all ages: less than 20 EU. Other results will be reported as positive or negative. For this test, normal results are: Negative for endomysial IgA, all ages. What do the results mean? High levels of antibodies or a positive result may mean that you have celiacdisease. High levels of gliadin antibodies may also be caused by Crohn's disease,colitis, and severe lactose intolerance. Talk with your  health care provider about what your results mean. Questions to ask your health care provider Ask your health care provider, or the department that is doing the test: When will my results be ready? How will I get my results? What are my treatment options? What other tests do I need? What are my next steps? Summary The celiac disease antibodies test is a blood test that is done to help diagnose celiac disease. This test examines your blood for the presence of specific antibodies that are common in people who have celiac disease. The presence of certain antibodies, or antibody levels that are above the normal range, may indicate that you  have celiac disease. Talk with your health care provider about what your results mean.

## 2021-07-11 ENCOUNTER — Encounter: Payer: Self-pay | Admitting: Orthopaedic Surgery

## 2021-07-11 ENCOUNTER — Ambulatory Visit (INDEPENDENT_AMBULATORY_CARE_PROVIDER_SITE_OTHER): Payer: 59 | Admitting: Orthopaedic Surgery

## 2021-07-11 DIAGNOSIS — G8929 Other chronic pain: Secondary | ICD-10-CM | POA: Diagnosis not present

## 2021-07-11 DIAGNOSIS — M25512 Pain in left shoulder: Secondary | ICD-10-CM

## 2021-07-11 NOTE — Progress Notes (Signed)
The patient comes in today with continued follow-up for left shoulder impingement syndrome.  She has had this with her right shoulder and had done quite well with an injection.  I injected her left shoulder back on June 14.  She is done much better overall with improving her motion but still having some pain with the shoulder.  She is also recently seen her rheumatologist and they are doing full work-up due to multiple joint complaints.  Examination of her left shoulder still shows some decrease in her forward flexion and abduction but overall seems to be improved.  Her pain is less.  Since she has not not gotten complete relief with the shoulder on the left side in terms of range of motion and pain, I would like to see her back in about 6 weeks to consider repeat injection if needed.  Obviously if she is continue to improve and doing better she can cancel that appointment.  She agrees with this treatment plan.  All questions and concerns were answered and addressed.

## 2021-07-12 LAB — ANA: Anti Nuclear Antibody (ANA): NEGATIVE

## 2021-07-12 LAB — CYCLIC CITRUL PEPTIDE ANTIBODY, IGG: Cyclic Citrullin Peptide Ab: <16 UNITS

## 2021-07-12 LAB — VITAMIN D 25 HYDROXY (VIT D DEFICIENCY, FRACTURES): Vit D, 25-Hydroxy: 28 ng/mL — ABNORMAL LOW (ref 30–100)

## 2021-07-12 LAB — B. BURGDORFI ANTIBODIES: B burgdorferi Ab IgG+IgM: <0.9 index

## 2021-07-12 LAB — SEDIMENTATION RATE: Sed Rate: 6 mm/h (ref 0–30)

## 2021-07-12 LAB — GLIADIN ANTIBODIES, SERUM
Gliadin IgA: 2.6 U/mL
Gliadin IgG: <1 U/mL

## 2021-07-12 LAB — IGA: Immunoglobulin A: 172 mg/dL (ref 70–320)

## 2021-07-12 LAB — TISSUE TRANSGLUTAMINASE, IGA: (tTG) Ab, IgA: <1 U/mL

## 2021-07-12 LAB — RHEUMATOID FACTOR: Rheumatoid fact SerPl-aCnc: <14 IU/mL (ref <14)

## 2021-07-26 ENCOUNTER — Telehealth: Payer: Self-pay

## 2021-07-26 NOTE — Telephone Encounter (Signed)
Patient called stating she had labwork at her appointment on 07/10/21 and hasn't received a call to discuss the results.  Patient states she was able to see the results on MyChart, but was "under the impression that she would receive a return call."

## 2021-07-27 NOTE — Telephone Encounter (Signed)
Lab results were all negative for indications of an autoimmune process causing current symptoms. We could take another look if symptoms worsen or change but for now no specific treatment recommendation. Her vitamin D level is low this can worsen the skin problems, is she taking a 1000 units supplement as listed on the record if so would benefit to increase this amount to 2000 units daily.

## 2021-07-27 NOTE — Telephone Encounter (Signed)
I called patient, patient verbalized understanding, patient has not been taking Vitamin D 1000 daily due to her father having a recent stroke; however, she will start taking Vitamin D daily,  Lab results were all negative for indications of an autoimmune process causing current symptoms. We could take another look if symptoms worsen or change but for now no specific treatment recommendation. Her vitamin D level is low this can worsen the skin problems, is she taking a 1000 units supplement as listed on the record if so would benefit to increase this amount to 2000 units daily.

## 2021-08-06 ENCOUNTER — Other Ambulatory Visit: Payer: Self-pay | Admitting: Orthopaedic Surgery

## 2021-08-22 ENCOUNTER — Ambulatory Visit: Payer: 59 | Admitting: Orthopaedic Surgery

## 2021-10-08 ENCOUNTER — Other Ambulatory Visit: Payer: Self-pay | Admitting: Orthopaedic Surgery

## 2022-12-23 DIAGNOSIS — E039 Hypothyroidism, unspecified: Secondary | ICD-10-CM | POA: Diagnosis not present

## 2023-01-09 DIAGNOSIS — R8271 Bacteriuria: Secondary | ICD-10-CM | POA: Diagnosis not present

## 2023-01-16 DIAGNOSIS — R202 Paresthesia of skin: Secondary | ICD-10-CM | POA: Diagnosis not present

## 2023-01-16 DIAGNOSIS — M2550 Pain in unspecified joint: Secondary | ICD-10-CM | POA: Diagnosis not present

## 2023-01-31 DIAGNOSIS — R3914 Feeling of incomplete bladder emptying: Secondary | ICD-10-CM | POA: Diagnosis not present

## 2023-01-31 DIAGNOSIS — N3020 Other chronic cystitis without hematuria: Secondary | ICD-10-CM | POA: Diagnosis not present

## 2023-03-05 NOTE — Progress Notes (Signed)
[facility]  PATIENT: Sandra Wyatt DOB: 10-28-1956  REFERRING DOCTOR OR PCP: Guerry Bruin MD SOURCE: Patient, notes from primary care, previous notes from neurology, imaging and lab reports, MRI images personally reviewed.  _________________________________   HISTORICAL  CHIEF COMPLAINT:  Chief Complaint  Patient presents with   Room 11    Pt is here Alone. Pt states that she has tingling in her left foot. Pt states that she has a pain in her hip that comes at night and its a shocking pain. Pt states that she is also concerned about some tremors in her left arm and hand.    HISTORY OF PRESENT ILLNESS:  She is a 67 year old woman who has intermittent electrical like pain in the left flank.   Changing positions does not usually help but moving around sometimes does.   It usually starts when she is laying in bed or the couch.   The episodes started in late 2023.  The distribution is from the back across the flank to below the breat.  Sometimes her left hip also tingles.   She also has painful tingling in the left foot with a vibrating sensation.       She also notes a tremor in her left hand.   She is right-handed.  This was felt to be due to thyroid drugs at first but occurs even with normal labs.   She notes it when she holds her granddaughter's hand and sometimes when she uses it.  She notes it very mildly on the right.  She notes her son also has a very mild tremor.  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  She also had noted a vibrating sensation in her feet at that time.  On her exam at that time sensation was normal.  Reflexes were increased at the knees.  Plantar responses were normal.  Gait and strength were normal.  We had checked MRI of the brain, cervical spine and thoracic spine.  These were normal for age.  Specifically, there was  no spinal cord pathology.  Her brother was diagnosed with MS at age 53 when he had severe numbness but has done well. His diagnosis was apparently confirmed with CSF analysis. His daughter (her niece) was diagnosed with MS in her early 80's and had optic neuritis.   Imaging: MRI of the brain 03/05/2017 showed some scattered T2/FLAIR hyperintense foci consistent with age-appropriate minimal chronic microvascular ischemic change.  MRI of the cervical and thoracic spine 03/23/2017 showed a normal spinal cord.  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   REVIEW OF SYSTEMS: Constitutional: No fevers, chills, sweats, or change in appetite Eyes: No visual changes, double vision, eye pain Ear, nose and throat: No hearing loss, ear pain, nasal congestion, sore throat Cardiovascular: No chest pain, palpitations Respiratory:  No shortness of breath at rest or with exertion.   No wheezes GastrointestinaI: No nausea, vomiting, diarrhea, abdominal pain, fecal incontinence Genitourinary:  No dysuria, urinary retention or frequency.  No nocturia. Musculoskeletal:  No neck pain, back pain Integumentary: No rash, pruritus, skin lesions Neurological: as above Psychiatric: No depression at this time.  No anxiety Endocrine: No palpitations, diaphoresis, change in appetite, change in weigh or increased thirst Hematologic/Lymphatic:  No anemia, purpura, petechiae.  Allergic/Immunologic: No itchy/runny eyes, nasal congestion, recent allergic reactions, rashes  ALLERGIES: Allergies  Allergen Reactions   Cephalexin Hives, Itching and Rash   Nitrofurantoin Rash    Other Reaction(s): rash all over   Sulfa Antibiotics Rash    HOME MEDICATIONS:  Current Outpatient Medications:    cholecalciferol (VITAMIN D) 1000 units tablet, Take 1,000 Units by mouth daily., Disp: , Rfl:    Multiple Vitamin (MULTI VITAMIN DAILY PO), Take by mouth  daily., Disp: , Rfl:    SYNTHROID 112 MCG tablet, Take 112 mcg by mouth daily., Disp: , Rfl: 5   meloxicam (MOBIC) 7.5 MG tablet, TAKE 1 TABLET BY MOUTH 2 TIMES DAILY FOR PAIN, Disp: 60 tablet, Rfl: 1   PREMARIN vaginal cream, INSERT 1gram via applicator VAGINALLY ONCE WEEKLY (Patient not taking: Reported on 07/10/2021), Disp: , Rfl: 11   promethazine (PHENERGAN) 25 MG tablet, as needed., Disp: , Rfl:   PAST MEDICAL HISTORY: Past Medical History:  Diagnosis Date   Thyroid disease    Vision abnormalities     PAST SURGICAL HISTORY: Past Surgical History:  Procedure Laterality Date   TONSILLECTOMY     WISDOM TOOTH EXTRACTION      FAMILY HISTORY: Family History  Problem Relation Age of Onset   Stroke Mother    Heart disease Mother    Stroke Father    Diabetes Father    Migraines Sister    Fibromyalgia Sister    Sarcoidosis Sister    Sarcoidosis Sister    Lymphoma Sister    Renal Disease Sister    HIV/AIDS Brother    Healthy Son    Healthy Daughter    Breast cancer Neg Hx    Colon cancer Neg Hx    Esophageal cancer Neg Hx    Stomach cancer Neg Hx    Rectal cancer Neg Hx     SOCIAL HISTORY: Social History   Socioeconomic History   Marital status: Married    Spouse name: Not on file   Number of children: Not on file   Years of education: Not on file   Highest education level: Not on file  Occupational History   Not on file  Tobacco Use   Smoking status: Never   Smokeless tobacco: Never  Vaping Use   Vaping Use: Never used  Substance and Sexual Activity   Alcohol use: Yes    Comment: rare   Drug use: No   Sexual activity: Not on file  Other Topics Concern   Not on file  Social History Narrative   Right Handed   Coffee every now and then.    Soda every now and then   Social Determinants of Corporate investment banker Strain: Not on file  Food Insecurity: Not on file  Transportation Needs: Not on file  Physical Activity: Not on file  Stress: Not on  file  Social Connections: Not on file  Intimate Partner Violence: Not on file       PHYSICAL EXAM  Vitals:   03/06/23 1032  BP: (!) 149/88  Pulse: 72  Weight: 174 lb 8 oz (79.2 kg)  Height: 5\' 6"  (1.676 m)    Body mass index is 28.17 kg/m.   General: The patient is well-developed and well-nourished and in no acute distress  HEENT:  Head is [place]/AT.  Sclera are anicteric.     Neck: No carotid bruits are noted.  The neck is nontender.  Cardiovascular: The heart has a regular rate and rhythm with  a normal S1 and S2. There were no murmurs, gallops or rubs.    Skin: Extremities are without rash or  edema.  Musculoskeletal:  Back is nontender  Neurologic Exam  Mental status: The patient is alert and oriented x 3 at the time of the examination. The patient has apparent normal recent and remote memory, with an apparently normal attention span and concentration ability.   Speech is normal.  Cranial nerves: Extraocular movements are full. Pupils are equal, round, and reactive to light and accomodation.  Visual fields are full.  Facial symmetry is present. There is good facial sensation to soft touch bilaterally.Facial strength is normal.  Trapezius and sternocleidomastoid strength is normal. No dysarthria is noted.  The tongue is midline, and the patient has symmetric elevation of the soft palate. No obvious hearing deficits are noted.  Motor:  Low amplitude rapid intention tremor left hand > right.  Increased with drawing/writing.    Muscle bulk is normal.   Tone is normal. Strength is  5 / 5 in all 4 extremities.   Sensory: Sensory testing is intact to pinprick, soft touch and vibration sensation in all 4 extremities.  Nrmal left flank sensation  Coordination: Cerebellar testing reveals good finger-nose-finger and heel-to-shin bilaterally.  Gait and station: Station is normal.   Gait is normal. Tandem gait is normal. Romberg is negative.   Reflexes: Deep tendon reflexes are  symmetric and normal in arms and mildly increased in the legs.   Plantar responses are flexor.    DIAGNOSTIC DATA (LABS, IMAGING, TESTING) - I reviewed patient records, labs, notes, testing and imaging myself where available.  Lab Results  Component Value Date   HGB 13.6 05/24/2017   HCT 40.0 05/24/2017      Component Value Date/Time   NA 139 05/24/2017 1806   K 3.9 05/24/2017 1806   CL 106 05/24/2017 1806   GLUCOSE 91 05/24/2017 1806   BUN 14 05/24/2017 1806   CREATININE 0.80 05/24/2017 1806       ASSESSMENT AND PLAN  Chronic left-sided thoracic back pain - Plan: MR THORACIC SPINE WO CONTRAST  Dysesthesia - Plan: MR THORACIC SPINE WO CONTRAST  Benign essential tremor   In summary, Ms. Sundt is a 67 year old woman with intermittent electric-like pain in the left flank radiating from the back to under the breast.  Symptoms occur every day with episodes lasting minutes to hours.  The etiology is uncertain.  Based on the distribution I would be most concerned about a thoracic radiculopathy.  Therefore we need to check an MRI of the thoracic spine to further evaluate.  This will also allow [place] to rule out spinal cord disorders.  To help with the symptoms I have started her on gabapentin and she will increase the dose to 300 mg p.o. 3 times daily.  A second problem is tremor most consistent with a benign essential tremor involving the left hand more than the right hand.  She is right-hand dominant.  We discussed that beta-blockers such as propranolol ER have a high likelihood of helping this type of tremor.  At this time she would like to hold off on treatment but would reconsider if symptoms worsen.  I did not schedule follow-up but she will return to see [place] based on the results of the studies or if she has new or worsening neurologic symptoms.  We discussed that the gabapentin dose can be increased if needed and we could also start a beta-blocker if the tremor worsens.  Aurore Redinger  A. Epimenio Foot, MD, [facility] 03/06/2023, 10:57 AM Certified in Neurology, Clinical Neurophysiology, Sleep Medicine and Neuroimaging  [facility] [address] [place], [place] [address] [phone]

## 2023-03-06 ENCOUNTER — Ambulatory Visit (INDEPENDENT_AMBULATORY_CARE_PROVIDER_SITE_OTHER): Payer: PPO | Admitting: Neurology

## 2023-03-06 ENCOUNTER — Encounter: Payer: Self-pay | Admitting: Neurology

## 2023-03-06 ENCOUNTER — Telehealth: Payer: Self-pay | Admitting: Neurology

## 2023-03-06 VITALS — BP 149/88 | HR 72 | Ht 66.0 in | Wt 174.5 lb

## 2023-03-06 DIAGNOSIS — R208 Other disturbances of skin sensation: Secondary | ICD-10-CM | POA: Diagnosis not present

## 2023-03-06 DIAGNOSIS — G8929 Other chronic pain: Secondary | ICD-10-CM | POA: Diagnosis not present

## 2023-03-06 DIAGNOSIS — G250 Essential tremor: Secondary | ICD-10-CM

## 2023-03-06 DIAGNOSIS — M546 Pain in thoracic spine: Secondary | ICD-10-CM

## 2023-03-06 MED ORDER — GABAPENTIN 300 MG PO CAPS: 300.0000 mg | ORAL_CAPSULE | Freq: Three times a day (TID) | ORAL | 11 refills | Status: DC

## 2023-03-06 NOTE — Telephone Encounter (Signed)
[facility] adv NPR sent to GI [phone] 

## 2023-03-10 ENCOUNTER — Other Ambulatory Visit (INDEPENDENT_AMBULATORY_CARE_PROVIDER_SITE_OTHER): Payer: PPO

## 2023-03-10 ENCOUNTER — Encounter: Payer: Self-pay | Admitting: Orthopaedic Surgery

## 2023-03-10 ENCOUNTER — Ambulatory Visit (INDEPENDENT_AMBULATORY_CARE_PROVIDER_SITE_OTHER): Payer: Medicare Other | Admitting: Orthopaedic Surgery

## 2023-03-10 DIAGNOSIS — M25512 Pain in left shoulder: Secondary | ICD-10-CM | POA: Diagnosis not present

## 2023-03-10 DIAGNOSIS — G8929 Other chronic pain: Secondary | ICD-10-CM

## 2023-03-10 DIAGNOSIS — M25561 Pain in right knee: Secondary | ICD-10-CM

## 2023-03-10 MED ORDER — MELOXICAM 15 MG PO TABS: 15.0000 mg | ORAL_TABLET | Freq: Every day | ORAL | 1 refills | Status: DC | PRN

## 2023-03-10 NOTE — Progress Notes (Signed)
The patient comes in today with 2 chief complaints.  She has been dealing with left shoulder pain for many years off and on but she points to the scapular area as a source of her pain on the left side.  I have seen her for her left shoulder before.  She denies any pain in the shoulder joint itself and reports good motion of her left shoulder.  The pain does wake her up at night and again she points to the parascapular area.  She is also been dealing with right knee pain along the medial aspect of the knee for about 4 to 5 months now with no known injury.  Is all along the medial joint space.  There is no locking catching but there certainly is pain.  Examination of the left shoulder shows no smoothly and fluidly.  She does have pain along the medial border of the scapula and the muscles in's area and there is definitely trigger point pain.  3 views left shoulder are normal.  Examination of the right knee shows medial joint line transfer no effusion.  Range of motion is full and there is a slightly positive McMurray's on the medial compartment.  Her Lachman's exam is normal.  2 views of the left knee show just some mild patellofemoral narrowing but otherwise good alignment and open medial lateral compartments.  I did offer a trigger point injection in the parascapular area on her left side as well as a left knee steroid injection and she agreed to both of these.  I would like to see her back in 4 weeks to see if she would end up needing physical therapy or even MRI of the knee.  She is actually scheduled by her neurologist to have an MRI of her lumbar spine coming up so we can also review this as well.  She agrees with this treatment plan.  I am going to send in some meloxicam as an anti-inflammatory to try.

## 2023-03-14 ENCOUNTER — Ambulatory Visit
Admission: RE | Admit: 2023-03-14 | Discharge: 2023-03-14 | Disposition: A | Discharge status: Home / Self Care | Payer: PPO | Source: Ambulatory Visit | Attending: Neurology | Admitting: Neurology

## 2023-03-14 DIAGNOSIS — R208 Other disturbances of skin sensation: Secondary | ICD-10-CM

## 2023-03-14 DIAGNOSIS — G8929 Other chronic pain: Secondary | ICD-10-CM

## 2023-03-14 DIAGNOSIS — M546 Pain in thoracic spine: Secondary | ICD-10-CM | POA: Diagnosis not present

## 2023-03-24 ENCOUNTER — Other Ambulatory Visit: Payer: Self-pay

## 2023-04-14 ENCOUNTER — Other Ambulatory Visit: Payer: Self-pay

## 2023-04-14 ENCOUNTER — Encounter: Payer: Self-pay | Admitting: Orthopaedic Surgery

## 2023-04-14 ENCOUNTER — Ambulatory Visit (INDEPENDENT_AMBULATORY_CARE_PROVIDER_SITE_OTHER): Payer: PPO | Admitting: Orthopaedic Surgery

## 2023-04-14 DIAGNOSIS — M25512 Pain in left shoulder: Secondary | ICD-10-CM

## 2023-04-14 DIAGNOSIS — G8929 Other chronic pain: Secondary | ICD-10-CM | POA: Diagnosis not present

## 2023-04-14 NOTE — Progress Notes (Signed)
The patient comes in today after we saw her last visit and placed a steroid injection in her right knee and then 1 along the medial border of her left scapula.  She said the trigger point helped in the scapular area as far as the medial border the scapula but she is having a different trigger point now more toward the lateral aspect of the scapula on the left shoulder.  She says her right knee is completely asymptomatic and is done great.  On exam her left shoulder moves smoothly and fluidly but she does have a trigger point that I can feel in the mid scapular area on her left side.  The remainder of her shoulder exam is normal with excellent strength.  She would like to try outpatient physical therapy and I recommended this as well.  I think any modalities including dry needling that the therapist can try as well as ultrasound or e-stim would be helpful in this area.  The patient will be going to [facility] soon to check on her 34 year old father in about 2 weeks so her therapy can start after that.  We can always try another trigger point steroid injection if need be.  All questions concerns were answered addressed.  Will work on setting her up for outpatient physical therapy.

## 2023-04-28 DIAGNOSIS — R3914 Feeling of incomplete bladder emptying: Secondary | ICD-10-CM | POA: Diagnosis not present

## 2023-04-28 DIAGNOSIS — N3020 Other chronic cystitis without hematuria: Secondary | ICD-10-CM | POA: Diagnosis not present

## 2023-04-28 DIAGNOSIS — N200 Calculus of kidney: Secondary | ICD-10-CM | POA: Diagnosis not present

## 2023-05-13 ENCOUNTER — Other Ambulatory Visit: Payer: Self-pay | Admitting: Orthopaedic Surgery

## 2023-05-13 DIAGNOSIS — R059 Cough, unspecified: Secondary | ICD-10-CM | POA: Diagnosis not present

## 2023-05-13 DIAGNOSIS — B349 Viral infection, unspecified: Secondary | ICD-10-CM | POA: Diagnosis not present

## 2023-05-27 ENCOUNTER — Telehealth: Payer: Self-pay | Admitting: Neurology

## 2023-05-27 ENCOUNTER — Other Ambulatory Visit: Payer: Self-pay | Admitting: Neurology

## 2023-05-27 ENCOUNTER — Encounter: Payer: Self-pay | Admitting: Neurology

## 2023-05-27 MED ORDER — PROPRANOLOL HCL ER 60 MG PO CP24: 60.0000 mg | ORAL_CAPSULE | Freq: Every day | ORAL | 5 refills | Status: DC

## 2023-05-27 NOTE — Telephone Encounter (Signed)
Pt called phone room this encounter sent to provider. Once I receive response from Dr.Sater I will reply to patient.

## 2023-05-27 NOTE — Telephone Encounter (Signed)
Dr.Sater per last office visit on 03/06/23   "We discussed that beta-blockers such as propranolol ER have a high likelihood of helping this type of tremor. At this time she would like to hold off on treatment but would reconsider if symptoms worsen. "  Pt would like to start on Rx to be sent to [facility] pharmacy.   Please advise

## 2023-05-27 NOTE — Telephone Encounter (Signed)
Pt stated she would like to start taking medication for her Tremors. Stated she talked about with Dr. Epimenio Foot at her last visit.

## 2023-05-28 NOTE — Telephone Encounter (Signed)
Called pt. Relayed Dr. Bonnita Hollow message. Went over instructions on how to take propranolol. She will call if she has any tolerability issues or it is ineffective. Scheduled 6 month f/u for 08/25/23 at 2pm since rx prescribed so she can check in with MD then on how things are going.  She requested beginning of September d/t going on a trip end of September.

## 2023-07-22 ENCOUNTER — Other Ambulatory Visit: Payer: Self-pay | Admitting: Orthopaedic Surgery

## 2023-07-27 ENCOUNTER — Emergency Department (HOSPITAL_BASED_OUTPATIENT_CLINIC_OR_DEPARTMENT_OTHER)
Admission: EM | Admit: 2023-07-27 | Discharge: 2023-07-27 | Disposition: A | Discharge status: Home / Self Care | Payer: PPO | Attending: Emergency Medicine | Admitting: Emergency Medicine

## 2023-07-27 ENCOUNTER — Other Ambulatory Visit: Payer: Self-pay

## 2023-07-27 ENCOUNTER — Encounter (HOSPITAL_BASED_OUTPATIENT_CLINIC_OR_DEPARTMENT_OTHER): Payer: Self-pay | Admitting: Emergency Medicine

## 2023-07-27 ENCOUNTER — Emergency Department (HOSPITAL_BASED_OUTPATIENT_CLINIC_OR_DEPARTMENT_OTHER): Payer: PPO | Admitting: Radiology

## 2023-07-27 DIAGNOSIS — Z1152 Encounter for screening for COVID-19: Secondary | ICD-10-CM | POA: Diagnosis not present

## 2023-07-27 DIAGNOSIS — R002 Palpitations: Secondary | ICD-10-CM | POA: Insufficient documentation

## 2023-07-27 DIAGNOSIS — R5383 Other fatigue: Secondary | ICD-10-CM | POA: Diagnosis not present

## 2023-07-27 DIAGNOSIS — E039 Hypothyroidism, unspecified: Secondary | ICD-10-CM | POA: Insufficient documentation

## 2023-07-27 LAB — URINALYSIS, W/ REFLEX TO CULTURE (INFECTION SUSPECTED)
Bacteria, UA: NONE SEEN
Bilirubin Urine: NEGATIVE
Glucose, UA: NEGATIVE mg/dL
Hgb urine dipstick: NEGATIVE
Leukocytes,Ua: NEGATIVE
Nitrite: NEGATIVE
Protein, ur: NEGATIVE mg/dL
Specific Gravity, Urine: 1.005 (ref 1.005–1.030)
pH: 6.5 (ref 5.0–8.0)

## 2023-07-27 LAB — BASIC METABOLIC PANEL
Anion gap: 7 (ref 5–15)
BUN: 9 mg/dL (ref 8–23)
CO2: 26 mmol/L (ref 22–32)
Calcium: 9.8 mg/dL (ref 8.9–10.3)
Chloride: 104 mmol/L (ref 98–111)
Creatinine, Ser: 0.66 mg/dL (ref 0.44–1.00)
GFR, Estimated: >60 mL/min (ref >60)
Glucose, Bld: 125 mg/dL — ABNORMAL HIGH (ref 70–99)
Potassium: 4 mmol/L (ref 3.5–5.1)
Sodium: 137 mmol/L (ref 135–145)

## 2023-07-27 LAB — CBC
HCT: 39.7 % (ref 36.0–46.0)
Hemoglobin: 13.2 g/dL (ref 12.0–15.0)
MCH: 29.3 pg (ref 26.0–34.0)
MCHC: 33.2 g/dL (ref 30.0–36.0)
MCV: 88 fL (ref 80.0–100.0)
Platelets: 330 10*3/uL (ref 150–400)
RBC: 4.51 MIL/uL (ref 3.87–5.11)
RDW: 12.5 % (ref 11.5–15.5)
WBC: 9.7 10*3/uL (ref 4.0–10.5)
nRBC: 0 % (ref 0.0–0.2)

## 2023-07-27 LAB — TROPONIN I (HIGH SENSITIVITY): Troponin I (High Sensitivity): 2 ng/L (ref <18)

## 2023-07-27 LAB — SARS CORONAVIRUS 2 BY RT PCR: SARS Coronavirus 2 by RT PCR: NEGATIVE

## 2023-07-27 MED ORDER — ONDANSETRON HCL 4 MG/2ML IJ SOLN
4.0000 mg | Freq: Once | INTRAMUSCULAR | Status: AC
  Administered 2023-07-27: 4 mg via INTRAVENOUS
  Filled 2023-07-27: qty 2

## 2023-07-27 MED ORDER — SODIUM CHLORIDE 0.9 % IV BOLUS
1000.0000 mL | Freq: Once | INTRAVENOUS | Status: AC
  Administered 2023-07-27: 1000 mL via INTRAVENOUS

## 2023-07-27 NOTE — Discharge Instructions (Signed)
The workup in the emergency room is reassuring.  We have not noticed any arrhythmias.  The blood work shows no signs of electrolyte abnormality, heart attack, renal problems.  COVID-19 test is negative as well.  Urine shows no signs of infection or profound dehydration.  At this time, we recommend that you if you continue to feel palpitations that you consult your primary care doctor.  They might consider putting you on a Holter or cardiac event monitor 24/7 for few days to see if arrhythmia can be picked up.  Return to the emergency room if you start having worsening palpitations, chest pain, shortness of breath, near fainting or fainting.

## 2023-07-27 NOTE — ED Notes (Signed)
Patient verbalizes understanding of discharge instructions. Opportunity for questioning and answers were provided. Patient discharged from ED.  °

## 2023-07-27 NOTE — ED Triage Notes (Signed)
Pt arrives to ED with c/o palpitations since 430am this morning. Pt notes she son doxycycline for a UTI currently. Hx hypothyroid.

## 2023-07-27 NOTE — ED Provider Notes (Signed)
[place] EMERGENCY DEPARTMENT AT [facility] Provider Note   CSN: [id number] Arrival date & time: 07/27/23  0732     History  Chief Complaint  Patient presents with   Palpitations    Sandra Wyatt is a 67 y.o. female.  HPI    67 year old female comes in with chief complaint of palpitations.  Pt started having nausea, fatigue and palpitations since yday evening. Symptoms worsened overnight.  Patient recently finished a course of doxycycline for suspected UTI.  She also had COVID-19 in July.  Patient has history of hypothyroidism and is taking propranolol, which she started 2 weeks ago.  Pt has no hx of PE, DVT and denies any exogenous hormone (testosterone / estrogen) use, long distance travels or surgery in the past 6 weeks, active cancer, recent immobilization.   Home Medications Prior to Admission medications   Medication Sig Start Date End Date Taking? Authorizing Provider  propranolol ER (INDERAL LA) 60 MG 24 hr capsule Take 1 capsule (60 mg total) by mouth daily. 05/27/23   Sater, Pearletha Furl, MD  cholecalciferol (VITAMIN D) 1000 units tablet Take 1,000 Units by mouth daily.    [provider]  gabapentin (NEURONTIN) 300 MG capsule Take 1 capsule (300 mg total) by mouth 3 (three) times daily. 03/06/23   Sater, Pearletha Furl, MD  meloxicam (MOBIC) 15 MG tablet TAKE 1 TABLET BY MOUTH DAILY AS NEEDED FOR PAIN 07/22/23   Kathryne Hitch, MD  Multiple Vitamin (MULTI VITAMIN DAILY PO) Take by mouth daily.    [provider]  SYNTHROID 112 MCG tablet Take 112 mcg by mouth daily. 02/14/17   [provider]      Allergies    Cephalexin, Nitrofurantoin, and Sulfa antibiotics    Review of Systems   Review of Systems  All other systems reviewed and are negative.   Physical Exam Updated Vital Signs BP (!) 155/80 (BP Location: Right Arm)   Pulse 68   Temp 98 F (36.7 C) (Oral)   Resp 16   Ht 5\' 6"  (1.676 m)   Wt 77.1 kg   SpO2 99%    BMI 27.44 kg/m  Physical Exam Vitals and nursing note reviewed.  Constitutional:      Appearance: She is well-developed.  HENT:     Head: Atraumatic.  Cardiovascular:     Rate and Rhythm: Normal rate.  Pulmonary:     Effort: Pulmonary effort is normal.  Musculoskeletal:     Cervical back: Normal range of motion and neck supple.  Skin:    General: Skin is warm and dry.  Neurological:     Mental Status: She is alert and oriented to person, place, and time.     ED Results / Procedures / Treatments   Labs (all labs ordered are listed, but only abnormal results are displayed) Labs Reviewed  BASIC METABOLIC PANEL - Abnormal; Notable for the following components:      Result Value   Glucose, Bld 125 (*)    All other components within normal limits  URINALYSIS, W/ REFLEX TO CULTURE (INFECTION SUSPECTED) - Abnormal; Notable for the following components:   Color, Urine COLORLESS (*)    Ketones, ur TRACE (*)    All other components within normal limits  SARS CORONAVIRUS 2 BY RT PCR  CBC  TROPONIN I (HIGH SENSITIVITY)    EKG EKG Interpretation Date/Time:  Sunday July 27 2023 07:52:07 EDT Ventricular Rate:  69 PR Interval:  150 QRS Duration:  98 QT  Interval:  420 QTC Calculation: 450 R Axis:   51  Text Interpretation: Sinus rhythm Probable left atrial enlargement No acute changes No significant change since last tracing Confirmed by Derwood Kaplan [phone] on 07/27/2023 7:58:42 AM  Radiology DG Chest 2 View  Result Date: 07/27/2023 CLINICAL DATA:  67 year old female with history of palpitations. EXAM: CHEST - 2 VIEW COMPARISON:  No priors. FINDINGS: Lung volumes are normal. No consolidative airspace disease. No pleural effusions. No pneumothorax. No pulmonary nodule or mass noted. Pulmonary vasculature and the cardiomediastinal silhouette are within normal limits. IMPRESSION: No radiographic evidence of acute cardiopulmonary disease. Electronically Signed   By: Trudie Reed M.D.   On: 07/27/2023 08:40    Procedures Procedures    Medications Ordered in ED Medications  ondansetron (ZOFRAN) injection 4 mg (4 mg Intravenous Given 07/27/23 1030)  sodium chloride 0.9 % bolus 1,000 mL (1,000 mLs Intravenous New Bag/Given 07/27/23 1030)    ED Course/ Medical Decision Making/ A&P                                 Medical Decision Making Amount and/or Complexity of Data Reviewed Labs: ordered. Radiology: ordered.  Risk Prescription drug management.   67 year old female with history of thyroid disease comes in with chief complaint of palpitations and some other vague symptoms.  Differential diagnosis for her includes URI/viral disease, COVID-19, severe electrolyte abnormality, palpitations secondary to tacky dysrhythmias, PVCs. Clinically patient is not demonstrating any symptoms of hyperthyroidism.  Plan is to get basic labs, COVID-19 test and have patient on cardiac telemetry monitoring.  Reassessment: I have reviewed patient's cardiac telemetry monitoring strip at 10 PM and then again at 12:15 PM.  Patient has not had any arrhythmia alarms.  Further detail looked into the strip did not reveal any concerning arrhythmia/PVCs, PACs either.  Patient indicates that she has been having palpitations while in the ER.  I have advised that she gets Holter monitor if symptoms continue and to return to the ER if her symptoms get worse.  Final Clinical Impression(s) / ED Diagnoses Final diagnoses:  Palpitations    Rx / DC Orders ED Discharge Orders     None         Derwood Kaplan, MD 07/27/23 1227

## 2023-07-29 DIAGNOSIS — B349 Viral infection, unspecified: Secondary | ICD-10-CM | POA: Diagnosis not present

## 2023-07-29 DIAGNOSIS — F411 Generalized anxiety disorder: Secondary | ICD-10-CM | POA: Diagnosis not present

## 2023-07-31 DIAGNOSIS — H04123 Dry eye syndrome of bilateral lacrimal glands: Secondary | ICD-10-CM | POA: Diagnosis not present

## 2023-08-01 ENCOUNTER — Telehealth: Payer: Self-pay

## 2023-08-01 NOTE — Telephone Encounter (Signed)
Transition Care Management Follow-up Telephone Call Date of discharge and from where: 07/27/2023 [facility] How have you been since you were released from the hospital? Patient stated she is feeling better. Any questions or concerns? No  Items Reviewed: Did the pt receive and understand the discharge instructions provided? Yes  Medications obtained and verified?  No medication prescribed. Other? No  Any new allergies since your discharge? No  Dietary orders reviewed? Yes Do you have support at home? Yes   Follow up appointments reviewed:  PCP Hospital f/u appt confirmed? No  Scheduled to see  on  @ . Specialist Hospital f/u appt confirmed? Yes  Scheduled to see Despina Arias, MD on 08/25/2023 @ [facility]. Are transportation arrangements needed? No  If their condition worsens, is the pt aware to call PCP or go to the Emergency Dept.? Yes Was the patient provided with contact information for the PCP's office or ED? Yes Was to pt encouraged to call back with questions or concerns? Yes  Sindi Beckworth [facility]  [facility] Population Health Community Resource Care Guide   [email or web address]  ?? [phone]   Website: [email or web address]  [email or web address]

## 2023-08-25 ENCOUNTER — Ambulatory Visit: Payer: PPO | Admitting: Neurology

## 2023-08-25 ENCOUNTER — Encounter: Payer: Self-pay | Admitting: Neurology

## 2023-08-25 VITALS — BP 131/82 | HR 87 | Ht 66.0 in | Wt 170.0 lb

## 2023-08-25 DIAGNOSIS — G8929 Other chronic pain: Secondary | ICD-10-CM | POA: Diagnosis not present

## 2023-08-25 DIAGNOSIS — R208 Other disturbances of skin sensation: Secondary | ICD-10-CM | POA: Diagnosis not present

## 2023-08-25 DIAGNOSIS — M546 Pain in thoracic spine: Secondary | ICD-10-CM

## 2023-08-25 DIAGNOSIS — E039 Hypothyroidism, unspecified: Secondary | ICD-10-CM

## 2023-08-25 DIAGNOSIS — G250 Essential tremor: Secondary | ICD-10-CM

## 2023-08-25 DIAGNOSIS — R251 Tremor, unspecified: Secondary | ICD-10-CM | POA: Diagnosis not present

## 2023-08-25 NOTE — Progress Notes (Signed)
[facility]  PATIENT: Sandra Wyatt DOB: 03/07/1956  REFERRING DOCTOR OR PCP: Guerry Bruin MD SOURCE: Patient, notes from primary care, previous notes from neurology, imaging and lab reports, MRI images personally reviewed.  _________________________________   HISTORICAL  CHIEF COMPLAINT:  Chief Complaint  Patient presents with   Follow-up    Pt in room 11. Here for Chronic left-sided thoracic back pain follow up. Pt stopped Propranolol caused heart papillations, sore throat. Pt states tremors are stable, noticed caffeine makes tremor worse. States back pain is better, stopped gabapentin.    HISTORY OF PRESENT ILLNESS:  Sandra Wyatt is a 67 y.o. woman with tremor and thoracic pain  Update 08/25/2023 She tried propranolol for the essential tremor but it was poorly tolerated so she stopped.   Tremor is tolerable as L worse than right and she is right handed.   Does not affect ADL much.  She recalls her mom was told she had too much copper in her body (though more concern was due to taking eye vitamins)  Her flank pain is doing better.   This was much worse last year.   Gabapentin had helped some but she did not like how she felt and stopped.  Pain had ben like an electric shock The episodes started in late 2023.  The distribution is from the back across the flank to below the breat.   She also notes a tremor in her left hand.   She is right-handed.  This was felt to be due to thyroid drugs at first but occurs even with normal labs.   She notes it when she holds her granddaughter's hand and sometimes when she uses it.  She notes it very mildly on the right.  She notes her son also has a very mild tremor.  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  She also had noted a vibrating sensation in her feet at that time.  On her exam at that time  sensation was normal.  Reflexes were increased at the knees.  Plantar responses were normal.  Gait and strength were normal.  We had checked MRI of the brain, cervical spine and thoracic spine.  These were normal for age.  Specifically, there was no spinal cord pathology.  Her brother was diagnosed with MS at age 31 when he had severe numbness but has done well. His diagnosis was apparently confirmed with CSF analysis. His daughter (her niece) was diagnosed with MS in her early 72's and had optic neuritis.   Imaging: MRI of the brain 03/05/2017 showed some scattered T2/FLAIR hyperintense foci consistent with age-appropriate minimal chronic microvascular ischemic change.  MRI of the cervical and thoracic spine 03/23/2017 showed a normal spinal cord.  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   REVIEW OF SYSTEMS: Constitutional: No fevers, chills, sweats, or change in appetite Eyes: No visual changes, double vision, eye pain Ear, nose and throat: No hearing loss, ear pain, nasal congestion, sore throat Cardiovascular: No chest pain, palpitations Respiratory:  No shortness of breath at rest or with exertion.   No wheezes GastrointestinaI: No nausea, vomiting, diarrhea, abdominal pain, fecal incontinence Genitourinary:  No dysuria, urinary retention or frequency.  No nocturia. Musculoskeletal:  No neck pain, back pain Integumentary: No rash, pruritus, skin  lesions Neurological: as above Psychiatric: No depression at this time.  No anxiety Endocrine: No palpitations, diaphoresis, change in appetite, change in weigh or increased thirst Hematologic/Lymphatic:  No anemia, purpura, petechiae. Allergic/Immunologic: No itchy/runny eyes, nasal congestion, recent allergic reactions, rashes  ALLERGIES: Allergies  Allergen Reactions   Cephalexin Hives, Itching and Rash   Nitrofurantoin Rash    Other Reaction(s): rash all over    Sulfa Antibiotics Rash    HOME MEDICATIONS:  Current Outpatient Medications:    cholecalciferol (VITAMIN D) 1000 units tablet, Take 1,000 Units by mouth daily., Disp: , Rfl:    levothyroxine (SYNTHROID) 100 MCG tablet, Take 100 mcg by mouth daily before breakfast., Disp: , Rfl:    meloxicam (MOBIC) 15 MG tablet, TAKE 1 TABLET BY MOUTH DAILY AS NEEDED FOR PAIN, Disp: 30 tablet, Rfl: 1   Multiple Vitamin (MULTI VITAMIN DAILY PO), Take by mouth daily., Disp: , Rfl:    propranolol ER (INDERAL LA) 60 MG 24 hr capsule, Take 1 capsule (60 mg total) by mouth daily. (Patient not taking: Reported on 08/25/2023), Disp: 30 capsule, Rfl: 5   gabapentin (NEURONTIN) 300 MG capsule, Take 1 capsule (300 mg total) by mouth 3 (three) times daily. (Patient not taking: Reported on 08/25/2023), Disp: 90 capsule, Rfl: 11   SYNTHROID 112 MCG tablet, Take 112 mcg by mouth daily. (Patient not taking: Reported on 08/25/2023), Disp: , Rfl: 5  PAST MEDICAL HISTORY: Past Medical History:  Diagnosis Date   Thyroid disease    Vision abnormalities     PAST SURGICAL HISTORY: Past Surgical History:  Procedure Laterality Date   TONSILLECTOMY     WISDOM TOOTH EXTRACTION      FAMILY HISTORY: Family History  Problem Relation Age of Onset   Stroke Mother    Heart disease Mother    Stroke Father    Diabetes Father    Migraines Sister    Fibromyalgia Sister    Sarcoidosis Sister    Sarcoidosis Sister    Lymphoma Sister    Renal Disease Sister    HIV/AIDS Brother    Healthy Son    Healthy Daughter    Breast cancer Neg Hx    Colon cancer Neg Hx    Esophageal cancer Neg Hx    Stomach cancer Neg Hx    Rectal cancer Neg Hx     SOCIAL HISTORY: Social History   Socioeconomic History   Marital status: Married    Spouse name: Not on file   Number of children: Not on file   Years of education: Not on file   Highest education level: Not on file  Occupational History   Not on file  Tobacco Use   Smoking  status: Never   Smokeless tobacco: Never  Vaping Use   Vaping status: Never Used  Substance and Sexual Activity   Alcohol use: Yes    Comment: rare   Drug use: No   Sexual activity: Not on file  Other Topics Concern   Not on file  Social History Narrative   Right Handed   Coffee every now and then.    Soda every now and then   Social Determinants of Corporate investment banker Strain: Not on file  Food Insecurity: Not on file  Transportation Needs: Not on file  Physical Activity: Not on file  Stress: Not on file  Social Connections: Not on file  Intimate Partner Violence: Not on file       PHYSICAL EXAM  Vitals:  08/25/23 1332  BP: 131/82  Pulse: 87  Weight: 170 lb (77.1 kg)  Height: 5\' 6"  (1.676 m)    Body mass index is 27.44 kg/m.   General: The patient is well-developed and well-nourished and in no acute distress  HEENT:  Head is [place]/AT.  Sclera are anicteric.     Neck: No carotid bruits are noted.  The neck is nontender.  Cardiovascular: The heart has a regular rate and rhythm with a normal S1 and S2. There were no murmurs, gallops or rubs.    Skin: Extremities are without rash or  edema.  Musculoskeletal:  Back is nontender  Neurologic Exam  Mental status: The patient is alert and oriented x 3 at the time of the examination. The patient has apparent normal recent and remote memory, with an apparently normal attention span and concentration ability.   Speech is normal.  Cranial nerves: Extraocular movements are full. Pupils are equal, round, and reactive to light and accomodation.  Visual fields are full.  Facial symmetry is present. There is good facial sensation to soft touch bilaterally.Facial strength is normal.  Trapezius and sternocleidomastoid strength is normal. No dysarthria is noted.  The tongue is midline, and the patient has symmetric elevation of the soft palate. No obvious hearing deficits are noted.  Motor: She has a low amplitude rapid  intention tremor worse on the left hand.      Muscle bulk is normal.   Tone is normal. Strength is  5 / 5 in all 4 extremities.   Sensory: Sensory testing is intact to pinprick, soft touch and vibration sensation in all 4 extremities.     Coordination: Cerebellar testing reveals good finger-nose-finger and heel-to-shin bilaterally.  Gait and station: Station is normal.   Gait is normal. Tandem gait is normal. Romberg is negative.   Reflexes: Deep tendon reflexes are symmetric and normal in arms and mildly increased in the legs.   Plantar responses are flexor.    DIAGNOSTIC DATA (LABS, IMAGING, TESTING) - I reviewed patient records, labs, notes, testing and imaging myself where available.  Lab Results  Component Value Date   WBC 9.7 07/27/2023   HGB 13.2 07/27/2023   HCT 39.7 07/27/2023   MCV 88.0 07/27/2023   PLT 330 07/27/2023      Component Value Date/Time   NA 137 07/27/2023 0753   K 4.0 07/27/2023 0753   CL 104 07/27/2023 0753   CO2 26 07/27/2023 0753   GLUCOSE 125 (H) 07/27/2023 0753   BUN 9 07/27/2023 0753   CREATININE 0.66 07/27/2023 0753   CALCIUM 9.8 07/27/2023 0753   GFRNONAA >60 07/27/2023 0753       ASSESSMENT AND PLAN  Tremor - Plan: Copper, serum, Ceruloplasmin, TSH  Chronic left-sided thoracic back pain  Dysesthesia  Benign essential tremor  Hypothyroidism, unspecified type - Plan: TSH   Her tremor is most likely benign essential tremor.  However, she reports that her mother had elevated copper levels.  I will check blood work for copper and ceruloplasmin.  Will also check TSH to see if Synthroid needs to be adjusted. Stay active and exercise as tolerated.  No evidence of parkinsonism. If thoracic pain worsens could consider lamotrigine as she did not tolerate gabapentin well. Return as needed for new or worsening symptoms.   Massiah Longanecker A. Epimenio Foot, MD, [facility] 08/25/2023, 7:55 PM Certified in Neurology, Clinical Neurophysiology, Sleep Medicine and  Neuroimaging  [facility] [address], [address] [place], [place] [address] [phone]

## 2023-08-28 LAB — TSH: TSH: 0.103 u[IU]/mL — ABNORMAL LOW (ref 0.450–4.500)

## 2023-08-28 LAB — CERULOPLASMIN: Ceruloplasmin: 31.2 mg/dL (ref 19.0–39.0)

## 2023-08-28 LAB — COPPER, SERUM: Copper: 130 ug/dL (ref 80–158)

## 2023-09-22 ENCOUNTER — Other Ambulatory Visit: Payer: Self-pay | Admitting: Orthopaedic Surgery

## 2023-10-06 DIAGNOSIS — E559 Vitamin D deficiency, unspecified: Secondary | ICD-10-CM | POA: Diagnosis not present

## 2023-10-06 DIAGNOSIS — Z1212 Encounter for screening for malignant neoplasm of rectum: Secondary | ICD-10-CM | POA: Diagnosis not present

## 2023-10-06 DIAGNOSIS — Z1389 Encounter for screening for other disorder: Secondary | ICD-10-CM | POA: Diagnosis not present

## 2023-10-06 DIAGNOSIS — E039 Hypothyroidism, unspecified: Secondary | ICD-10-CM | POA: Diagnosis not present

## 2023-10-06 DIAGNOSIS — Z0001 Encounter for general adult medical examination with abnormal findings: Secondary | ICD-10-CM | POA: Diagnosis not present

## 2023-10-06 DIAGNOSIS — E781 Pure hyperglyceridemia: Secondary | ICD-10-CM | POA: Diagnosis not present

## 2023-10-13 DIAGNOSIS — E063 Autoimmune thyroiditis: Secondary | ICD-10-CM | POA: Diagnosis not present

## 2023-10-13 DIAGNOSIS — M8580 Other specified disorders of bone density and structure, unspecified site: Secondary | ICD-10-CM | POA: Diagnosis not present

## 2023-10-13 DIAGNOSIS — E7800 Pure hypercholesterolemia, unspecified: Secondary | ICD-10-CM | POA: Diagnosis not present

## 2023-10-13 DIAGNOSIS — Z1331 Encounter for screening for depression: Secondary | ICD-10-CM | POA: Diagnosis not present

## 2023-10-13 DIAGNOSIS — G629 Polyneuropathy, unspecified: Secondary | ICD-10-CM | POA: Diagnosis not present

## 2023-10-13 DIAGNOSIS — N6019 Diffuse cystic mastopathy of unspecified breast: Secondary | ICD-10-CM | POA: Diagnosis not present

## 2023-10-13 DIAGNOSIS — Z0000 Encounter for general adult medical examination without abnormal findings: Secondary | ICD-10-CM | POA: Diagnosis not present

## 2023-10-13 DIAGNOSIS — E559 Vitamin D deficiency, unspecified: Secondary | ICD-10-CM | POA: Diagnosis not present

## 2023-10-13 DIAGNOSIS — R82998 Other abnormal findings in urine: Secondary | ICD-10-CM | POA: Diagnosis not present

## 2023-10-13 DIAGNOSIS — E039 Hypothyroidism, unspecified: Secondary | ICD-10-CM | POA: Diagnosis not present

## 2023-10-13 DIAGNOSIS — Z1339 Encounter for screening examination for other mental health and behavioral disorders: Secondary | ICD-10-CM | POA: Diagnosis not present

## 2023-10-13 DIAGNOSIS — F418 Other specified anxiety disorders: Secondary | ICD-10-CM | POA: Diagnosis not present

## 2023-10-13 DIAGNOSIS — N319 Neuromuscular dysfunction of bladder, unspecified: Secondary | ICD-10-CM | POA: Diagnosis not present

## 2023-10-15 DIAGNOSIS — H00025 Hordeolum internum left lower eyelid: Secondary | ICD-10-CM | POA: Diagnosis not present

## 2023-10-15 DIAGNOSIS — H04123 Dry eye syndrome of bilateral lacrimal glands: Secondary | ICD-10-CM | POA: Diagnosis not present

## 2023-10-24 DIAGNOSIS — Z8379 Family history of other diseases of the digestive system: Secondary | ICD-10-CM | POA: Diagnosis not present

## 2023-10-24 DIAGNOSIS — Z8349 Family history of other endocrine, nutritional and metabolic diseases: Secondary | ICD-10-CM | POA: Diagnosis not present

## 2023-10-24 DIAGNOSIS — E039 Hypothyroidism, unspecified: Secondary | ICD-10-CM | POA: Diagnosis not present

## 2023-10-24 DIAGNOSIS — R197 Diarrhea, unspecified: Secondary | ICD-10-CM | POA: Diagnosis not present

## 2023-10-24 DIAGNOSIS — E063 Autoimmune thyroiditis: Secondary | ICD-10-CM | POA: Diagnosis not present

## 2023-10-28 DIAGNOSIS — N950 Postmenopausal bleeding: Secondary | ICD-10-CM | POA: Diagnosis not present

## 2023-11-05 ENCOUNTER — Other Ambulatory Visit (INDEPENDENT_AMBULATORY_CARE_PROVIDER_SITE_OTHER): Payer: PPO

## 2023-11-05 ENCOUNTER — Ambulatory Visit: Payer: PPO | Admitting: Gastroenterology

## 2023-11-05 ENCOUNTER — Encounter: Payer: Self-pay | Admitting: Gastroenterology

## 2023-11-05 VITALS — BP 152/96 | HR 82 | Ht 66.0 in | Wt 168.2 lb

## 2023-11-05 DIAGNOSIS — R1032 Left lower quadrant pain: Secondary | ICD-10-CM

## 2023-11-05 DIAGNOSIS — K5900 Constipation, unspecified: Secondary | ICD-10-CM | POA: Diagnosis not present

## 2023-11-05 LAB — BASIC METABOLIC PANEL
BUN: 13 mg/dL (ref 6–23)
CO2: 27 meq/L (ref 19–32)
Calcium: 9.4 mg/dL (ref 8.4–10.5)
Chloride: 102 meq/L (ref 96–112)
Creatinine, Ser: 0.7 mg/dL (ref 0.40–1.20)
GFR: 89.48 mL/min (ref >60.00)
Glucose, Bld: 99 mg/dL (ref 70–99)
Potassium: 3.9 meq/L (ref 3.5–5.1)
Sodium: 135 meq/L (ref 135–145)

## 2023-11-05 NOTE — Patient Instructions (Signed)
Start Miralax 1 capful daily in 8 ounces of liquid.  Your provider has requested that you go to the basement level for lab work before leaving today. Press "B" on the elevator. The lab is located at the first door on the left as you exit the elevator.   You have been scheduled for a CT scan of the abdomen and pelvis at [facility], 1st floor Radiology. You are scheduled on Monday 11/10/23 at 4 pm. You should arrive 1:45 pm to your appointment time for registration and to drink oral contrast.  Please follow the written instructions below on the day of your exam:   1) Do not eat anything after 12 pm (4 hours prior to your test)    You may take any medications as prescribed with a small amount of water, if necessary. If you take any of the following medications: METFORMIN, GLUCOPHAGE, GLUCOVANCE, AVANDAMET, RIOMET, FORTAMET, ACTOPLUS MET, JANUMET, GLUMETZA or METAGLIP, you MAY be asked to HOLD this medication 48 hours AFTER the exam.   The purpose of you drinking the oral contrast is to aid in the visualization of your intestinal tract. The contrast solution may cause some diarrhea. Depending on your individual set of symptoms, you may also receive an intravenous injection of x-ray contrast/dye. Plan on being at [facility] for 45 minutes or longer, depending on the type of exam you are having performed.   If you have any questions regarding your exam or if you need to reschedule, you may call [facility] at [phone] between the hours of 8:00 am and 5:00 pm, Monday-Friday.

## 2023-11-05 NOTE — Progress Notes (Signed)
Noted  

## 2023-11-05 NOTE — Progress Notes (Addendum)
11/05/2023 ADDI JO [id number] 1956-08-09   HISTORY OF PRESENT ILLNESS:  This is a patient of Dr. Lamar Sprinkles, known to him only for colonoscopy.  Colonoscopy 09/2017:  - Two 1 to 2 mm polyps in the proximal transverse colon, removed with a cold snare. Resected and retrieved. - Diverticulosis in the left colon and in the right colon. - Internal hemorrhoids. - The examination was otherwise normal on direct and retroflexion views.  Surgical [P], transverse, polyp (2) - FRAGMENTS OF BENIGN POLYPOID COLORECTAL MUCOSA. - THERE IS NO EVIDENCE OF MALIGNANCY.  Repeat recommended in 10 years.  She is here today with complaints of LLQ abdominal pain and constipation.  She says that she has been having this pain for about 3 weeks.  Initially she thought it was GYN related so she saw them first but they said that everything looked fine.  She says that she has issues with moving her bowels and then will have episodes of diarrhea.  No rectal bleeding.  Says that she saw blood on the toilet paper once but she was sure that it was from her vagina/the front.   Past Medical History:  Diagnosis Date   Thyroid disease    Vision abnormalities    Past Surgical History:  Procedure Laterality Date   TONSILLECTOMY     WISDOM TOOTH EXTRACTION      reports that she has never smoked. She has never used smokeless tobacco. She reports current alcohol use. She reports that she does not use drugs. family history includes Diabetes in her father; Fibromyalgia in her sister; HIV/AIDS in her brother; Healthy in her daughter and son; Heart disease in her mother; Lymphoma in her sister; Migraines in her sister; Renal Disease in her sister; Sarcoidosis in her sister and sister; Stroke in her father and mother. Allergies  Allergen Reactions   Cephalexin Hives, Itching and Rash   Nitrofurantoin Rash    Other Reaction(s): rash all over   Sulfa Antibiotics Rash      Outpatient Encounter Medications as of  11/05/2023  Medication Sig   cholecalciferol (VITAMIN D) 1000 units tablet Take 1,000 Units by mouth daily.   levothyroxine (SYNTHROID) 100 MCG tablet Take 100 mcg by mouth daily before breakfast.   meloxicam (MOBIC) 15 MG tablet TAKE 1 TABLET BY MOUTH DAILY AS NEEDED FOR PAIN   Multiple Vitamin (MULTI VITAMIN DAILY PO) Take by mouth daily.   [DISCONTINUED] gabapentin (NEURONTIN) 300 MG capsule Take 1 capsule (300 mg total) by mouth 3 (three) times daily. (Patient not taking: Reported on 08/25/2023)   [DISCONTINUED] propranolol ER (INDERAL LA) 60 MG 24 hr capsule Take 1 capsule (60 mg total) by mouth daily. (Patient not taking: Reported on 08/25/2023)   [DISCONTINUED] SYNTHROID 112 MCG tablet Take 112 mcg by mouth daily. (Patient not taking: Reported on 08/25/2023)   No facility-administered encounter medications on file as of 11/05/2023.    REVIEW OF SYSTEMS  : All other systems reviewed and negative except where noted in the History of Present Illness.   PHYSICAL EXAM: BP (!) 152/96   Pulse 82   Ht 5\' 6"  (1.676 m)   Wt 168 lb 3.2 oz (76.3 kg)   BMI 27.15 kg/m  General: Well developed white female in no acute distress Head: Normocephalic and atraumatic Eyes:  Sclerae anicteric, conjunctiva pink. Ears: Normal auditory acuity Lungs: Clear throughout to auscultation; no W/R/R. Heart: Regular rate and rhythm; no M/R/G. Abdomen: Soft, non-distended.  BS present.  Non-tender. Musculoskeletal: Symmetrical  with no gross deformities  Skin: No lesions on visible extremities Neurological: Alert oriented x 4, grossly non-focal Psychological:  Alert and cooperative. Normal mood and affect  ASSESSMENT AND PLAN: *67 year old female with complaints of left lower quadrant abdominal pain that has been present for the past few weeks.  Also complains of constipation and then will have episodes of diarrhea that are likely self catharsis.  Will check a CT of the abdomen and pelvis with contrast to rule out  diverticulitis.  Will treat underlying constipation with Miralax daily, can even start with a half dose daily and then titrate up if needed.  CC:  Tisovec, Adelfa Koh, MD

## 2023-11-10 ENCOUNTER — Ambulatory Visit (HOSPITAL_COMMUNITY)
Admission: RE | Admit: 2023-11-10 | Discharge: 2023-11-10 | Disposition: A | Discharge status: Home / Self Care | Payer: PPO | Source: Ambulatory Visit | Attending: Gastroenterology | Admitting: Gastroenterology

## 2023-11-10 DIAGNOSIS — R1032 Left lower quadrant pain: Secondary | ICD-10-CM | POA: Insufficient documentation

## 2023-11-10 DIAGNOSIS — N200 Calculus of kidney: Secondary | ICD-10-CM | POA: Diagnosis not present

## 2023-11-10 DIAGNOSIS — K5900 Constipation, unspecified: Secondary | ICD-10-CM | POA: Diagnosis not present

## 2023-11-10 DIAGNOSIS — K5730 Diverticulosis of large intestine without perforation or abscess without bleeding: Secondary | ICD-10-CM | POA: Diagnosis not present

## 2023-11-10 MED ORDER — IOHEXOL 300 MG/ML  SOLN
100.0000 mL | Freq: Once | INTRAMUSCULAR | Status: AC | PRN
  Administered 2023-11-10: 100 mL via INTRAVENOUS

## 2023-11-10 MED ORDER — IOHEXOL 300 MG/ML  SOLN
30.0000 mL | Freq: Once | INTRAMUSCULAR | Status: AC | PRN
  Administered 2023-11-10: 30 mL via ORAL

## 2023-11-19 ENCOUNTER — Other Ambulatory Visit: Payer: Self-pay | Admitting: Orthopaedic Surgery

## 2023-12-01 DIAGNOSIS — Z1231 Encounter for screening mammogram for malignant neoplasm of breast: Secondary | ICD-10-CM | POA: Diagnosis not present

## 2023-12-05 DIAGNOSIS — E039 Hypothyroidism, unspecified: Secondary | ICD-10-CM | POA: Diagnosis not present

## 2023-12-05 DIAGNOSIS — E063 Autoimmune thyroiditis: Secondary | ICD-10-CM | POA: Diagnosis not present

## 2024-01-28 ENCOUNTER — Other Ambulatory Visit: Payer: Self-pay | Admitting: Orthopaedic Surgery

## 2024-01-29 DIAGNOSIS — Z8379 Family history of other diseases of the digestive system: Secondary | ICD-10-CM | POA: Diagnosis not present

## 2024-01-29 DIAGNOSIS — Z8349 Family history of other endocrine, nutritional and metabolic diseases: Secondary | ICD-10-CM | POA: Diagnosis not present

## 2024-01-29 DIAGNOSIS — E063 Autoimmune thyroiditis: Secondary | ICD-10-CM | POA: Diagnosis not present

## 2024-01-29 DIAGNOSIS — E039 Hypothyroidism, unspecified: Secondary | ICD-10-CM | POA: Diagnosis not present

## 2024-03-11 ENCOUNTER — Ambulatory Visit (HOSPITAL_COMMUNITY)
Admission: RE | Admit: 2024-03-11 | Discharge: 2024-03-11 | Disposition: A | Discharge status: Home / Self Care | Source: Ambulatory Visit | Attending: Internal Medicine | Admitting: Internal Medicine

## 2024-03-11 ENCOUNTER — Other Ambulatory Visit (HOSPITAL_COMMUNITY): Payer: Self-pay | Admitting: Internal Medicine

## 2024-03-11 DIAGNOSIS — M79661 Pain in right lower leg: Secondary | ICD-10-CM

## 2024-03-11 DIAGNOSIS — N390 Urinary tract infection, site not specified: Secondary | ICD-10-CM | POA: Diagnosis not present

## 2024-03-11 DIAGNOSIS — R059 Cough, unspecified: Secondary | ICD-10-CM | POA: Diagnosis not present

## 2024-03-11 DIAGNOSIS — M7989 Other specified soft tissue disorders: Secondary | ICD-10-CM | POA: Diagnosis not present

## 2024-03-11 DIAGNOSIS — R509 Fever, unspecified: Secondary | ICD-10-CM | POA: Diagnosis not present

## 2024-03-11 DIAGNOSIS — E7800 Pure hypercholesterolemia, unspecified: Secondary | ICD-10-CM | POA: Diagnosis not present

## 2024-03-11 DIAGNOSIS — E039 Hypothyroidism, unspecified: Secondary | ICD-10-CM | POA: Diagnosis not present

## 2024-03-11 DIAGNOSIS — R350 Frequency of micturition: Secondary | ICD-10-CM | POA: Diagnosis not present

## 2024-03-11 DIAGNOSIS — R051 Acute cough: Secondary | ICD-10-CM | POA: Diagnosis not present

## 2024-03-11 DIAGNOSIS — R0981 Nasal congestion: Secondary | ICD-10-CM | POA: Diagnosis not present

## 2024-03-11 DIAGNOSIS — M79662 Pain in left lower leg: Secondary | ICD-10-CM | POA: Insufficient documentation

## 2024-03-11 DIAGNOSIS — Z1152 Encounter for screening for COVID-19: Secondary | ICD-10-CM | POA: Diagnosis not present

## 2024-03-11 NOTE — Progress Notes (Signed)
 Bilateral lower extremity venous duplex has been completed. Preliminary results can be found in CV Proc through chart review.  Results were given to [facility] at Dr. Deneen Harts office.  03/11/24 2:15 PM Olen Cordial RVT

## 2024-03-16 DIAGNOSIS — R8271 Bacteriuria: Secondary | ICD-10-CM | POA: Diagnosis not present

## 2024-03-16 DIAGNOSIS — N200 Calculus of kidney: Secondary | ICD-10-CM | POA: Diagnosis not present

## 2024-03-16 DIAGNOSIS — N3020 Other chronic cystitis without hematuria: Secondary | ICD-10-CM | POA: Diagnosis not present

## 2024-04-13 DIAGNOSIS — R3914 Feeling of incomplete bladder emptying: Secondary | ICD-10-CM | POA: Diagnosis not present

## 2024-04-13 DIAGNOSIS — N200 Calculus of kidney: Secondary | ICD-10-CM | POA: Diagnosis not present

## 2024-04-13 DIAGNOSIS — N3020 Other chronic cystitis without hematuria: Secondary | ICD-10-CM | POA: Diagnosis not present

## 2024-06-14 ENCOUNTER — Other Ambulatory Visit: Payer: Self-pay

## 2024-06-14 ENCOUNTER — Other Ambulatory Visit (HOSPITAL_BASED_OUTPATIENT_CLINIC_OR_DEPARTMENT_OTHER): Payer: Self-pay

## 2024-06-14 ENCOUNTER — Encounter (HOSPITAL_BASED_OUTPATIENT_CLINIC_OR_DEPARTMENT_OTHER): Payer: Self-pay | Admitting: Emergency Medicine

## 2024-06-14 ENCOUNTER — Emergency Department (HOSPITAL_BASED_OUTPATIENT_CLINIC_OR_DEPARTMENT_OTHER)
Admission: EM | Admit: 2024-06-14 | Discharge: 2024-06-14 | Disposition: A | Discharge status: Home / Self Care | Attending: Emergency Medicine | Admitting: Emergency Medicine

## 2024-06-14 DIAGNOSIS — R21 Rash and other nonspecific skin eruption: Secondary | ICD-10-CM | POA: Insufficient documentation

## 2024-06-14 MED ORDER — PREDNISONE 20 MG PO TABS
40.0000 mg | ORAL_TABLET | Freq: Every day | ORAL | 0 refills | Status: AC
  Filled 2024-06-14: qty 10, 5d supply, fill #0

## 2024-06-14 NOTE — ED Provider Notes (Signed)
 [place] EMERGENCY DEPARTMENT AT [address] [facility] Provider Note   CSN: [id number] Arrival date & time: 06/14/24  1019     Patient presents with: Blister   Sandra Wyatt is a 68 y.o. female.   HPI   68 year old female presents emergency department reports of rash.  Rash present on her left leg, chest, right flank, left arm.  Rash present for the past couple of weeks.  States that rash is sometimes itchy but mostly painful when the rash appears.  Denies any fevers, chills.  Denies any known new allergen exposure.  Denies any recent changes in hygiene products, soaps, lotions, detergents.  Denies any fever, chills, chest pain, shortness of breath, abdominal pain, nausea vomiting urinary symptoms, change in bowel habits.  Past medical history significant for thyroid  disease, visual abnormalities, family history of MS, hypercholesterolemia  Prior to Admission medications   Medication Sig Start Date End Date Taking? Authorizing Provider  predniSONE (DELTASONE) 20 MG tablet Take 2 tablets (40 mg total) by mouth daily with breakfast for 5 days. 06/14/24 06/19/24 Yes Silver Fell A, PA  cholecalciferol (VITAMIN D) 1000 units tablet Take 1,000 Units by mouth daily.    [provider]  levothyroxine (SYNTHROID) 100 MCG tablet Take 100 mcg by mouth daily before breakfast.    [provider]  meloxicam  (MOBIC ) 15 MG tablet TAKE 1 TABLET BY MOUTH DAILY AS NEEDED FOR PAIN 01/28/24   Vernetta Lonni GRADE, MD  Multiple Vitamin (MULTI VITAMIN DAILY PO) Take by mouth daily.    [provider]    Allergies: Cephalexin, Nitrofurantoin, and Sulfa antibiotics    Review of Systems  All other systems reviewed and are negative.   Updated Vital Signs BP (!) 154/102 (BP Location: Right Arm)   Pulse 92   Temp 98.8 F (37.1 C) (Oral)   Resp 16   SpO2 98%   Physical Exam Vitals and nursing note reviewed.  Constitutional:      General: She is not in acute  distress.    Appearance: She is well-developed.  HENT:     Head: Normocephalic and atraumatic.   Eyes:     Conjunctiva/sclera: Conjunctivae normal.    Cardiovascular:     Rate and Rhythm: Normal rate and regular rhythm.     Heart sounds: No murmur heard. Pulmonary:     Effort: Pulmonary effort is normal. No respiratory distress.     Breath sounds: Normal breath sounds.  Abdominal:     Palpations: Abdomen is soft.     Tenderness: There is no abdominal tenderness.   Musculoskeletal:        General: No swelling.     Cervical back: Neck supple.     Comments: Patient moves all 4 extremities without difficulty.  Pedal, posterior tibial and radial pulses 2+ bilaterally.  No lower extremity edema.   Skin:    General: Skin is warm and dry.     Capillary Refill: Capillary refill takes less than 2 seconds.     Comments: Isolated papular rash measuring a few millimeters in diameter.  Isolated areas numbering 6 on left lower extremity spread from ankle to left knee, few on upper chest, 5-6 on left arm, 3 on the back of right leg.   Neurological:     Mental Status: She is alert.   Psychiatric:        Mood and Affect: Mood normal.     (all labs ordered are listed, but only abnormal results are displayed) Labs Reviewed -  No data to display  EKG: None  Radiology: No results found.   Procedures   Medications Ordered in the ED - No data to display                                  Medical Decision Making Risk Prescription drug management.   This patient presents to the ED for concern of rash, this involves an extensive number of treatment options, and is a complaint that carries with it a high risk of complications and morbidity.  The differential diagnosis includes SJS/TN, contact dermatitis, cellulitis, shingles, necrotizing infection, erythema nodosum, other   Co morbidities that complicate the patient evaluation  See HPI   Additional history obtained:  Additional  history obtained from EMR External records from outside source obtained and reviewed including hospital records   Lab Tests:  N/a   Imaging Studies ordered:  N/a   Cardiac Monitoring: / EKG:  N/a   Consultations Obtained:  N/a   Problem List / ED Course / Critical interventions / Medication management  Rash Reevaluation of the patient showed that the patient stayed the same I have reviewed the patients home medicines and have made adjustments as needed   Social Determinants of Health:  Denies tobacco, illicit drug use.   Test / Admission - Considered:  Rash Vitals signs significant for blood pressure 154/102. Otherwise within normal range and stable throughout visit. 68 year old female presents emergency department reports of rash.  Rash present on her left leg, chest, right flank, left arm.  Rash present for the past couple of weeks.  States that rash is sometimes itchy but mostly painful when the rash appears.  Denies any fevers, chills.  Denies any known new allergen exposure.  Denies any recent changes in hygiene products, soaps, lotions, detergents.  Denies any fever, chills, chest pain, shortness of breath, abdominal pain, nausea vomiting urinary symptoms, change in bowel habits. On exam, about 20 years of isolated maculopapular rash most with appearance of bug bites without extending erythema, no obvious abscess or induration.  No clinical evidence of SJS/TENS.  Area not consistent with cellulitis, erysipelas, necrotizing infection.  Rash pending multiple dermatome; low suspicion for herpes zoster.  Patient has been using topical hydrocortisone which has been helping some with her symptoms.  Given multiple body parts involved, we will trial prednisone for treatment of rash.  Recommend follow-up with primary care for reassessment.  Treatment plan discussed with patient and she knows understanding was agreeable to said plan.  Patient is a well-appearing, afebrile In no  acute distress. Worrisome signs and symptoms were discussed with the patient, and the patient acknowledged understanding to return to the ED if noticed. Patient was stable upon discharge.       Final diagnoses:  Rash    ED Discharge Orders          Ordered    predniSONE (DELTASONE) 20 MG tablet  Daily with breakfast        06/14/24 1133               Silver Wonda LABOR, [place] 06/14/24 1145    Levander Houston, MD 06/16/24 [phone]

## 2024-06-14 NOTE — Discharge Instructions (Addendum)
 Discussed, we will place you on steroids for treatment of the rash.  Recommend follow-up with primary care as well as dermatology for reassessment.  Please do not hesitate to return to the emergency department if the worrisome signs and symptoms we discussed become apparent.

## 2024-06-14 NOTE — ED Triage Notes (Signed)
 Small blisters appeared on left leg and torso x 2 weeks ago. States they are painful. Denies fever.

## 2024-06-14 NOTE — ED Notes (Addendum)
 DC paperwork given and verbally understood.... No assessment, Provider discharged Pt before able to assess.

## 2024-06-15 DIAGNOSIS — H0264 Xanthelasma of left upper eyelid: Secondary | ICD-10-CM | POA: Diagnosis not present

## 2024-06-15 DIAGNOSIS — H0261 Xanthelasma of right upper eyelid: Secondary | ICD-10-CM | POA: Diagnosis not present

## 2024-06-15 DIAGNOSIS — L500 Allergic urticaria: Secondary | ICD-10-CM | POA: Diagnosis not present

## 2024-06-16 DIAGNOSIS — M2550 Pain in unspecified joint: Secondary | ICD-10-CM | POA: Diagnosis not present

## 2024-06-16 DIAGNOSIS — E039 Hypothyroidism, unspecified: Secondary | ICD-10-CM | POA: Diagnosis not present

## 2024-06-16 DIAGNOSIS — M79605 Pain in left leg: Secondary | ICD-10-CM | POA: Diagnosis not present

## 2024-06-16 DIAGNOSIS — M5416 Radiculopathy, lumbar region: Secondary | ICD-10-CM | POA: Diagnosis not present

## 2024-06-16 DIAGNOSIS — R21 Rash and other nonspecific skin eruption: Secondary | ICD-10-CM | POA: Diagnosis not present

## 2024-06-16 DIAGNOSIS — R202 Paresthesia of skin: Secondary | ICD-10-CM | POA: Diagnosis not present

## 2024-06-16 DIAGNOSIS — R5383 Other fatigue: Secondary | ICD-10-CM | POA: Diagnosis not present

## 2024-06-17 ENCOUNTER — Other Ambulatory Visit: Payer: Self-pay | Admitting: Adult Health

## 2024-06-17 DIAGNOSIS — R202 Paresthesia of skin: Secondary | ICD-10-CM

## 2024-06-17 DIAGNOSIS — M5416 Radiculopathy, lumbar region: Secondary | ICD-10-CM

## 2024-06-17 DIAGNOSIS — M79605 Pain in left leg: Secondary | ICD-10-CM

## 2024-06-22 ENCOUNTER — Ambulatory Visit
Admission: RE | Admit: 2024-06-22 | Discharge: 2024-06-22 | Disposition: A | Discharge status: Home / Self Care | Source: Ambulatory Visit | Attending: Adult Health

## 2024-06-22 DIAGNOSIS — M5416 Radiculopathy, lumbar region: Secondary | ICD-10-CM

## 2024-06-22 DIAGNOSIS — M79605 Pain in left leg: Secondary | ICD-10-CM

## 2024-06-22 DIAGNOSIS — M47816 Spondylosis without myelopathy or radiculopathy, lumbar region: Secondary | ICD-10-CM | POA: Diagnosis not present

## 2024-06-22 DIAGNOSIS — R202 Paresthesia of skin: Secondary | ICD-10-CM

## 2024-08-02 DIAGNOSIS — Z6827 Body mass index (BMI) 27.0-27.9, adult: Secondary | ICD-10-CM | POA: Diagnosis not present

## 2024-08-02 DIAGNOSIS — M1991 Primary osteoarthritis, unspecified site: Secondary | ICD-10-CM | POA: Diagnosis not present

## 2024-08-02 DIAGNOSIS — R5383 Other fatigue: Secondary | ICD-10-CM | POA: Diagnosis not present

## 2024-08-02 DIAGNOSIS — E663 Overweight: Secondary | ICD-10-CM | POA: Diagnosis not present

## 2024-08-02 DIAGNOSIS — R21 Rash and other nonspecific skin eruption: Secondary | ICD-10-CM | POA: Diagnosis not present

## 2024-08-02 DIAGNOSIS — M25562 Pain in left knee: Secondary | ICD-10-CM | POA: Diagnosis not present

## 2024-08-09 DIAGNOSIS — H0260 Xanthelasma of unspecified eye, unspecified eyelid: Secondary | ICD-10-CM | POA: Diagnosis not present

## 2024-08-09 DIAGNOSIS — L821 Other seborrheic keratosis: Secondary | ICD-10-CM | POA: Diagnosis not present

## 2024-08-09 DIAGNOSIS — L309 Dermatitis, unspecified: Secondary | ICD-10-CM | POA: Diagnosis not present

## 2024-08-19 ENCOUNTER — Other Ambulatory Visit (INDEPENDENT_AMBULATORY_CARE_PROVIDER_SITE_OTHER): Payer: Self-pay

## 2024-08-19 ENCOUNTER — Encounter: Payer: Self-pay | Admitting: Orthopaedic Surgery

## 2024-08-19 ENCOUNTER — Ambulatory Visit (INDEPENDENT_AMBULATORY_CARE_PROVIDER_SITE_OTHER): Admitting: Orthopaedic Surgery

## 2024-08-19 DIAGNOSIS — M25561 Pain in right knee: Secondary | ICD-10-CM

## 2024-08-19 DIAGNOSIS — M25562 Pain in left knee: Secondary | ICD-10-CM | POA: Diagnosis not present

## 2024-08-19 DIAGNOSIS — G8929 Other chronic pain: Secondary | ICD-10-CM

## 2024-08-19 MED ORDER — METHYLPREDNISOLONE 4 MG PO TABS: ORAL_TABLET | ORAL | 0 refills | Status: DC

## 2024-08-19 NOTE — Progress Notes (Signed)
 The patient is a 68 year old female that we have seen in the past.  She has been having left knee pain since about April of this year with no known injury but she has a lot of mechanical symptoms with locking and catching in the left knee and it hurts at the medial aspect of the knee.  Now she started develop some left hip pain and she points along the lateral aspect of her left hip.  She says the right knee pain is not as bad and she thinks this may be from compensating offloading her left knee.  She says she is not able to fully flex her left knee and pivoting activities because of severe pain with the left knee and again there has been locking and catching.  Examination of her left knee does show positive McMurray's exam to the medial compartment of her knee.  She has significant pain past 90 degrees of flexion with the left knee.  There is IT band pain and pain over the trochanteric area of the left hip.  The left hip itself moves smoothly and fluidly with no blocks rotation no pain in the groin.  Her right knee exam is a much more normal exam.  An AP and lateral both knees today shows neutral alignment with some slight narrowing of the patellofemoral joint and slight medial narrowing.  Based on clinical exam findings we are definitely concerned about her having a medial meniscal tear.  She is having locking and catching for 5 months now and this has been slowly getting worse for her.  Even her left knee exam is concerning for meniscal tear.  At this point we will put her on a steroid taper and send her for an MRI of her left knee to rule out a meniscal tear.  Once we see her back for the MRI we can go from there in terms of a treatment plan.  She does have a trip out of the country starting October 10 so we would like to see her before then for considering a steroid injection in her left knee to get her through that trip.

## 2024-08-20 ENCOUNTER — Other Ambulatory Visit: Payer: Self-pay

## 2024-08-20 DIAGNOSIS — G8929 Other chronic pain: Secondary | ICD-10-CM

## 2024-08-25 ENCOUNTER — Ambulatory Visit
Admission: RE | Admit: 2024-08-25 | Discharge: 2024-08-25 | Disposition: A | Discharge status: Home / Self Care | Source: Ambulatory Visit | Attending: Orthopaedic Surgery | Admitting: Orthopaedic Surgery

## 2024-08-25 DIAGNOSIS — M1712 Unilateral primary osteoarthritis, left knee: Secondary | ICD-10-CM | POA: Diagnosis not present

## 2024-08-25 DIAGNOSIS — G8929 Other chronic pain: Secondary | ICD-10-CM

## 2024-08-25 DIAGNOSIS — S83232A Complex tear of medial meniscus, current injury, left knee, initial encounter: Secondary | ICD-10-CM | POA: Diagnosis not present

## 2024-08-30 ENCOUNTER — Ambulatory Visit: Admitting: Orthopaedic Surgery

## 2024-09-03 DIAGNOSIS — E663 Overweight: Secondary | ICD-10-CM | POA: Diagnosis not present

## 2024-09-03 DIAGNOSIS — M1991 Primary osteoarthritis, unspecified site: Secondary | ICD-10-CM | POA: Diagnosis not present

## 2024-09-03 DIAGNOSIS — R21 Rash and other nonspecific skin eruption: Secondary | ICD-10-CM | POA: Diagnosis not present

## 2024-09-03 DIAGNOSIS — M25562 Pain in left knee: Secondary | ICD-10-CM | POA: Diagnosis not present

## 2024-09-03 DIAGNOSIS — Z6827 Body mass index (BMI) 27.0-27.9, adult: Secondary | ICD-10-CM | POA: Diagnosis not present

## 2024-09-08 ENCOUNTER — Ambulatory Visit: Admitting: Orthopaedic Surgery

## 2024-09-08 ENCOUNTER — Encounter: Payer: Self-pay | Admitting: Orthopaedic Surgery

## 2024-09-08 DIAGNOSIS — M25562 Pain in left knee: Secondary | ICD-10-CM

## 2024-09-08 DIAGNOSIS — G8929 Other chronic pain: Secondary | ICD-10-CM | POA: Diagnosis not present

## 2024-09-08 DIAGNOSIS — S83232D Complex tear of medial meniscus, current injury, left knee, subsequent encounter: Secondary | ICD-10-CM | POA: Diagnosis not present

## 2024-09-08 NOTE — Progress Notes (Signed)
 The patient comes in today to go over MRI of her left knee.  She is a very active 68 year old female with some locking catching in that knee and has failed conservative treatment.  She does state the knee is feeling a little bit better.  All the pain has been on the medial joint line.  On my exam today she still exhibits medial joint line tenderness and pain when rotating the tibia on the femur.  The MRI is reviewed with her of her left knee and it does show a complex tear of the medial meniscus with a flap component.  There is only slight thinning of the articular cartilage.  She is leaving the country for her trip around October 10.  We will see her back on October 6 to consider steroid injection in her left knee if to temporize her pain while she gets through her trip.  At some point she may end up considering an arthroscopic intervention if she continues to have mechanical symptoms I did explain this to her as well.  Will see her back in 12 days to consider steroid injection in her left knee.

## 2024-09-13 ENCOUNTER — Ambulatory Visit: Admitting: Orthopaedic Surgery

## 2024-09-20 ENCOUNTER — Ambulatory Visit: Admitting: Orthopaedic Surgery

## 2024-09-20 DIAGNOSIS — R399 Unspecified symptoms and signs involving the genitourinary system: Secondary | ICD-10-CM | POA: Diagnosis not present

## 2024-09-20 DIAGNOSIS — G8929 Other chronic pain: Secondary | ICD-10-CM

## 2024-09-20 DIAGNOSIS — M25562 Pain in left knee: Secondary | ICD-10-CM

## 2024-09-20 DIAGNOSIS — N3020 Other chronic cystitis without hematuria: Secondary | ICD-10-CM | POA: Diagnosis not present

## 2024-09-20 DIAGNOSIS — N200 Calculus of kidney: Secondary | ICD-10-CM | POA: Diagnosis not present

## 2024-09-20 DIAGNOSIS — S83232D Complex tear of medial meniscus, current injury, left knee, subsequent encounter: Secondary | ICD-10-CM

## 2024-09-20 MED ORDER — LIDOCAINE HCL 1 % IJ SOLN
3.0000 mL | INTRAMUSCULAR | Status: AC | PRN
Start: 2024-09-20 — End: 2024-09-20
  Administered 2024-09-20: 3 mL

## 2024-09-20 MED ORDER — METHYLPREDNISOLONE ACETATE 40 MG/ML IJ SUSP
40.0000 mg | INTRAMUSCULAR | Status: AC | PRN
  Administered 2024-09-20: 40 mg via INTRA_ARTICULAR

## 2024-09-20 NOTE — Progress Notes (Signed)
 The patient is a 68 year old active female who we are seeing today in the office to place a steroid injection in her left knee to help her through her trip where she is leaving the country toward the end of the week.  She has been doing a lot of walking that trip.  She is wearing a compressive knee sleeve and will wear compressive socks on the trip as well.  She does have a known meniscal tear of her left knee.  Left knee has no effusion today but definite medial joint line tenderness.  We did place a steroid injection in her left knee today which she tolerated well.  When she gets back from her trip if she continue to have left knee problems she knows to reach out to us  and we will consider an arthroscopic surgery on that knee.    Procedure Note  Patient: Sandra Wyatt             Date of Birth: [birth year]           MRN: [id number]             Visit Date: 09/20/2024  Procedures: Visit Diagnoses:  1. Complex tear of medial meniscus of left knee as current injury, subsequent encounter   2. Chronic pain of left knee     Large Joint Inj: L knee on 09/20/2024 3:16 PM Indications: diagnostic evaluation and pain Details: 22 G 1.5 in needle, superolateral approach  Arthrogram: No  Medications: 3 mL lidocaine  1 %; 40 mg methylPREDNISolone  acetate 40 MG/ML Outcome: tolerated well, no immediate complications Procedure, treatment alternatives, risks and benefits explained, specific risks discussed. Consent was given by the patient. Immediately prior to procedure a time out was called to verify the correct patient, procedure, equipment, support staff and site/side marked as required. Patient was prepped and draped in the usual sterile fashion.

## 2024-10-11 DIAGNOSIS — E559 Vitamin D deficiency, unspecified: Secondary | ICD-10-CM | POA: Diagnosis not present

## 2024-10-11 DIAGNOSIS — E039 Hypothyroidism, unspecified: Secondary | ICD-10-CM | POA: Diagnosis not present

## 2024-10-11 DIAGNOSIS — E063 Autoimmune thyroiditis: Secondary | ICD-10-CM | POA: Diagnosis not present

## 2024-10-11 DIAGNOSIS — E7800 Pure hypercholesterolemia, unspecified: Secondary | ICD-10-CM | POA: Diagnosis not present

## 2024-10-11 DIAGNOSIS — Z1212 Encounter for screening for malignant neoplasm of rectum: Secondary | ICD-10-CM | POA: Diagnosis not present

## 2024-10-18 ENCOUNTER — Encounter: Payer: Self-pay | Admitting: Radiology

## 2024-10-18 DIAGNOSIS — R82998 Other abnormal findings in urine: Secondary | ICD-10-CM | POA: Diagnosis not present

## 2024-10-21 DIAGNOSIS — L570 Actinic keratosis: Secondary | ICD-10-CM | POA: Diagnosis not present

## 2024-10-21 DIAGNOSIS — D1801 Hemangioma of skin and subcutaneous tissue: Secondary | ICD-10-CM | POA: Diagnosis not present

## 2024-10-21 DIAGNOSIS — L578 Other skin changes due to chronic exposure to nonionizing radiation: Secondary | ICD-10-CM | POA: Diagnosis not present

## 2024-10-21 DIAGNOSIS — L821 Other seborrheic keratosis: Secondary | ICD-10-CM | POA: Diagnosis not present

## 2024-10-21 DIAGNOSIS — H0260 Xanthelasma of unspecified eye, unspecified eyelid: Secondary | ICD-10-CM | POA: Diagnosis not present

## 2024-10-21 DIAGNOSIS — L814 Other melanin hyperpigmentation: Secondary | ICD-10-CM | POA: Diagnosis not present

## 2024-10-21 DIAGNOSIS — D229 Melanocytic nevi, unspecified: Secondary | ICD-10-CM | POA: Diagnosis not present

## 2024-11-18 ENCOUNTER — Other Ambulatory Visit (HOSPITAL_COMMUNITY): Payer: Self-pay

## 2024-12-22 ENCOUNTER — Other Ambulatory Visit: Payer: Self-pay | Admitting: Obstetrics and Gynecology

## 2024-12-22 DIAGNOSIS — R92333 Mammographic heterogeneous density, bilateral breasts: Secondary | ICD-10-CM

## 2025-01-06 ENCOUNTER — Ambulatory Visit
Admission: RE | Admit: 2025-01-06 | Discharge: 2025-01-06 | Disposition: A | Discharge status: Home / Self Care | Source: Ambulatory Visit | Attending: Obstetrics and Gynecology | Admitting: Obstetrics and Gynecology

## 2025-01-06 DIAGNOSIS — R92333 Mammographic heterogeneous density, bilateral breasts: Secondary | ICD-10-CM

## 2025-01-06 MED ORDER — GADOPICLENOL 0.5 MMOL/ML IV SOLN
7.0000 mL | Freq: Once | INTRAVENOUS | Status: AC | PRN
  Administered 2025-01-06: 7 mL via INTRAVENOUS

## 2025-01-18 ENCOUNTER — Ambulatory Visit: Admitting: Neurology

## 2025-01-18 ENCOUNTER — Encounter: Payer: Self-pay | Admitting: Neurology

## 2025-01-18 VITALS — BP 138/82 | HR 83 | Ht 66.0 in | Wt 164.0 lb

## 2025-01-18 DIAGNOSIS — N312 Flaccid neuropathic bladder, not elsewhere classified: Secondary | ICD-10-CM | POA: Diagnosis not present

## 2025-01-18 DIAGNOSIS — R208 Other disturbances of skin sensation: Secondary | ICD-10-CM

## 2025-01-18 DIAGNOSIS — M47812 Spondylosis without myelopathy or radiculopathy, cervical region: Secondary | ICD-10-CM | POA: Diagnosis not present

## 2025-01-18 DIAGNOSIS — E039 Hypothyroidism, unspecified: Secondary | ICD-10-CM

## 2025-01-18 DIAGNOSIS — R21 Rash and other nonspecific skin eruption: Secondary | ICD-10-CM

## 2025-01-18 DIAGNOSIS — G250 Essential tremor: Secondary | ICD-10-CM | POA: Diagnosis not present

## 2025-01-18 MED ORDER — PRIMIDONE 50 MG PO TABS: 50.0000 mg | ORAL_TABLET | Freq: Two times a day (BID) | ORAL | 11 refills | Status: AC

## 2025-01-19 ENCOUNTER — Telehealth: Payer: Self-pay | Admitting: Neurology

## 2025-01-19 ENCOUNTER — Ambulatory Visit: Payer: Self-pay | Admitting: Neurology

## 2025-01-19 LAB — ANGIOTENSIN CONVERTING ENZYME: Angio Convert Enzyme: 29 U/L (ref 14–82)

## 2025-01-19 NOTE — Telephone Encounter (Signed)
 no auth required sent to [facility] [phone]

## 2025-01-28 ENCOUNTER — Other Ambulatory Visit: Payer: Self-pay
